# Patient Record
Sex: Female | Born: 1955 | Race: White | Hispanic: No | Marital: Married | Smoking: Never smoker
Health system: Southern US, Academic
[De-identification: ages and names within clinical notes are randomized; demographics above are authoritative.]

## PROBLEM LIST (undated history)

## (undated) DIAGNOSIS — E785 Hyperlipidemia, unspecified: Secondary | ICD-10-CM

## (undated) DIAGNOSIS — L719 Rosacea, unspecified: Secondary | ICD-10-CM

## (undated) DIAGNOSIS — K219 Gastro-esophageal reflux disease without esophagitis: Secondary | ICD-10-CM

## (undated) DIAGNOSIS — R001 Bradycardia, unspecified: Secondary | ICD-10-CM

## (undated) DIAGNOSIS — K760 Fatty (change of) liver, not elsewhere classified: Secondary | ICD-10-CM

## (undated) DIAGNOSIS — R002 Palpitations: Secondary | ICD-10-CM

## (undated) DIAGNOSIS — I10 Essential (primary) hypertension: Secondary | ICD-10-CM

## (undated) DIAGNOSIS — C801 Malignant (primary) neoplasm, unspecified: Secondary | ICD-10-CM

## (undated) DIAGNOSIS — G4700 Insomnia, unspecified: Secondary | ICD-10-CM

## (undated) HISTORY — DX: Essential (primary) hypertension: I10

## (undated) HISTORY — PX: HX DILATION AND CURETTAGE: SHX78

## (undated) HISTORY — DX: Insomnia, unspecified: G47.00

## (undated) HISTORY — PX: MOUTH SURGERY: SHX715

## (undated) HISTORY — DX: Palpitations: R00.2

## (undated) HISTORY — DX: Gastro-esophageal reflux disease without esophagitis: K21.9

## (undated) HISTORY — DX: Fatty (change of) liver, not elsewhere classified: K76.0

## (undated) HISTORY — DX: Rosacea, unspecified: L71.9

## (undated) HISTORY — DX: Bradycardia, unspecified: R00.1

## (undated) HISTORY — PX: SKIN CANCER EXCISION: SHX779

## (undated) HISTORY — DX: Hyperlipidemia, unspecified: E78.5

## (undated) HISTORY — PX: COLONOSCOPY: SHX174

## (undated) HISTORY — DX: Gilbert syndrome: E80.4

## (undated) NOTE — Progress Notes (Signed)
 Formatting of this note might be different from the original.  Subjective   Patient ID: Barbara Park is a 77 y.o. female presenting to the Urgent Care with a chief complaint of Eye Problem (Right eye, draining, pus, x 3 days).    Right eye purulent drainage and possible stye to lower eyelid. States she had a telehealth visit this morning and the provider would not give her any antibiotics, told her to come here to be evaluated. She states there is no trauma.     History provided by:  Patient  Language interpreter used: No    Eye Problem  Location:  Right eye  Quality:  Tearing and burning  Severity:  Mild  Onset quality:  Sudden  Duration:  3 days  Timing:  Intermittent  Progression:  Waxing and waning  Chronicity:  New  Context: not direct trauma, not foreign body and not scratch    Relieved by:  Heat  Associated symptoms: crusting, discharge, itching and tearing    Risk factors: no previous injury to eye      Objective   BP 136/88 (BP Location: Left arm, Patient Position: Sitting, BP Cuff Size: Large adult)   Pulse 60   Temp 37.1 C (98.8 F) (Oral)   Resp 14   Ht 1.689 m (5' 6.5)   Wt 106 kg (233 lb)   SpO2 98%   BMI 37.04 kg/m     Physical Exam  Vitals reviewed.   Constitutional:       Appearance: Normal appearance.   Eyes:      General:         Right eye: Hordeolum present.      Conjunctiva/sclera:      Right eye: Exudate present.   Cardiovascular:      Rate and Rhythm: Normal rate.   Pulmonary:      Effort: Pulmonary effort is normal.   Skin:     General: Skin is warm and dry.   Neurological:      General: No focal deficit present.      Mental Status: She is alert and oriented to person, place, and time.   Psychiatric:         Mood and Affect: Mood normal.         Behavior: Behavior normal.         Assessment & Plan    Assessment & Plan  Hordeolum internum of right lower eyelid        Other orders    tobramycin-dexAMETHasone (Tobradex) ophthalmic suspension; Administer 1 drop into the right eye every 4  hours while awake for 10 days.    In-House Lab Results:   No results found for this or any previous visit.     In-House Imaging Reads:        Procedure Documentation:  Procedures     ED Course & MDM   MDM - Medical Decision Making: Differential diagnosis considered include: stye vs conjunctivitis  Electronically signed by Dufm Stephane Lunger, CRNP at 01/20/2024 11:31 AM EDT

---

## 1998-10-27 ENCOUNTER — Other Ambulatory Visit (HOSPITAL_COMMUNITY): Payer: Self-pay

## 2018-04-02 DIAGNOSIS — R5381 Other malaise: Secondary | ICD-10-CM | POA: Insufficient documentation

## 2018-04-02 DIAGNOSIS — E669 Obesity, unspecified: Secondary | ICD-10-CM | POA: Insufficient documentation

## 2019-09-01 DIAGNOSIS — N952 Postmenopausal atrophic vaginitis: Secondary | ICD-10-CM | POA: Insufficient documentation

## 2019-09-01 DIAGNOSIS — Z809 Family history of malignant neoplasm, unspecified: Secondary | ICD-10-CM | POA: Insufficient documentation

## 2021-07-18 ENCOUNTER — Other Ambulatory Visit (INDEPENDENT_AMBULATORY_CARE_PROVIDER_SITE_OTHER): Payer: Self-pay | Admitting: Family Medicine

## 2021-08-29 ENCOUNTER — Encounter (INDEPENDENT_AMBULATORY_CARE_PROVIDER_SITE_OTHER): Payer: Self-pay

## 2021-08-29 ENCOUNTER — Ambulatory Visit (INDEPENDENT_AMBULATORY_CARE_PROVIDER_SITE_OTHER): Payer: Self-pay | Admitting: OTOLARYNGOLOGY

## 2021-09-21 ENCOUNTER — Encounter (INDEPENDENT_AMBULATORY_CARE_PROVIDER_SITE_OTHER): Payer: Self-pay | Admitting: OTOLARYNGOLOGY

## 2021-10-13 ENCOUNTER — Other Ambulatory Visit (INDEPENDENT_AMBULATORY_CARE_PROVIDER_SITE_OTHER): Payer: Self-pay | Admitting: Family Medicine

## 2021-10-13 MED ORDER — METOPROLOL SUCCINATE ER 50 MG TABLET,EXTENDED RELEASE 24 HR
ORAL_TABLET | ORAL | 1 refills | Status: DC
Start: 2021-10-13 — End: 2021-10-24

## 2021-10-21 ENCOUNTER — Other Ambulatory Visit (INDEPENDENT_AMBULATORY_CARE_PROVIDER_SITE_OTHER): Payer: Self-pay | Admitting: Family Medicine

## 2021-12-14 ENCOUNTER — Encounter (INDEPENDENT_AMBULATORY_CARE_PROVIDER_SITE_OTHER): Payer: Self-pay | Admitting: Family Medicine

## 2022-02-07 ENCOUNTER — Encounter (INDEPENDENT_AMBULATORY_CARE_PROVIDER_SITE_OTHER): Payer: Medicare Other | Admitting: Family Medicine

## 2022-02-08 ENCOUNTER — Encounter (INDEPENDENT_AMBULATORY_CARE_PROVIDER_SITE_OTHER): Payer: Self-pay | Admitting: NURSE PRACTITIONER

## 2022-02-14 ENCOUNTER — Encounter (INDEPENDENT_AMBULATORY_CARE_PROVIDER_SITE_OTHER): Payer: Medicare Other | Admitting: Family Medicine

## 2022-03-08 ENCOUNTER — Encounter (INDEPENDENT_AMBULATORY_CARE_PROVIDER_SITE_OTHER): Payer: Self-pay | Admitting: INTERVENTIONAL CARDIOLOGY

## 2022-03-13 ENCOUNTER — Encounter (INDEPENDENT_AMBULATORY_CARE_PROVIDER_SITE_OTHER): Payer: Medicare Other | Admitting: NURSE PRACTITIONER

## 2022-03-14 ENCOUNTER — Encounter (INDEPENDENT_AMBULATORY_CARE_PROVIDER_SITE_OTHER): Payer: Medicare Other | Admitting: Family Medicine

## 2022-04-10 ENCOUNTER — Encounter (INDEPENDENT_AMBULATORY_CARE_PROVIDER_SITE_OTHER): Payer: Medicare Other | Admitting: NURSE PRACTITIONER

## 2022-04-17 ENCOUNTER — Ambulatory Visit (INDEPENDENT_AMBULATORY_CARE_PROVIDER_SITE_OTHER): Payer: Medicare Other | Admitting: Family Medicine

## 2022-04-17 ENCOUNTER — Other Ambulatory Visit: Payer: Self-pay

## 2022-04-17 ENCOUNTER — Encounter (INDEPENDENT_AMBULATORY_CARE_PROVIDER_SITE_OTHER): Payer: Self-pay | Admitting: Family Medicine

## 2022-04-17 VITALS — BP 121/84 | HR 78 | Temp 98.1°F | Resp 20 | Ht 66.0 in | Wt 232.0 lb

## 2022-04-17 DIAGNOSIS — E559 Vitamin D deficiency, unspecified: Secondary | ICD-10-CM

## 2022-04-17 DIAGNOSIS — E782 Mixed hyperlipidemia: Secondary | ICD-10-CM

## 2022-04-17 DIAGNOSIS — J323 Chronic sphenoidal sinusitis: Secondary | ICD-10-CM | POA: Insufficient documentation

## 2022-04-17 DIAGNOSIS — I10 Essential (primary) hypertension: Secondary | ICD-10-CM

## 2022-04-17 MED ORDER — PRAVASTATIN 40 MG TABLET
40.0000 mg | ORAL_TABLET | Freq: Every day | ORAL | 1 refills | Status: DC
Start: 2022-04-17 — End: 2022-11-17

## 2022-04-17 MED ORDER — CEFDINIR 300 MG CAPSULE
300.0000 mg | ORAL_CAPSULE | Freq: Two times a day (BID) | ORAL | 1 refills | Status: DC
Start: 2022-04-17 — End: 2022-07-03

## 2022-04-17 MED ORDER — FLUTICASONE PROPIONATE 50 MCG/ACTUATION NASAL SPRAY,SUSPENSION
2.0000 | Freq: Every day | NASAL | 1 refills | Status: AC | PRN
Start: 2022-04-17 — End: 2022-10-14

## 2022-04-17 MED ORDER — LISINOPRIL 10 MG-HYDROCHLOROTHIAZIDE 12.5 MG TABLET
1.0000 | ORAL_TABLET | Freq: Every day | ORAL | 1 refills | Status: DC
Start: 2022-04-17 — End: 2022-11-17

## 2022-04-17 MED ORDER — METOPROLOL SUCCINATE ER 50 MG TABLET,EXTENDED RELEASE 24 HR
ORAL_TABLET | ORAL | 1 refills | Status: DC
Start: 2022-04-17 — End: 2022-11-17

## 2022-04-17 MED ORDER — FLUCONAZOLE 150 MG TABLET
150.0000 mg | ORAL_TABLET | Freq: Once | ORAL | 1 refills | Status: AC
Start: 2022-04-17 — End: 2022-04-17

## 2022-04-17 MED ORDER — ERGOCALCIFEROL (VITAMIN D2) 1,250 MCG (50,000 UNIT) CAPSULE
50000.0000 [IU] | ORAL_CAPSULE | ORAL | 1 refills | Status: AC
Start: 2022-04-17 — End: 2022-10-14

## 2022-04-17 MED ORDER — MUPIROCIN 2 % TOPICAL OINTMENT
TOPICAL_OINTMENT | Freq: Three times a day (TID) | CUTANEOUS | 2 refills | Status: DC
Start: 2022-04-17 — End: 2022-07-03

## 2022-04-17 NOTE — Nursing Note (Signed)
04/17/22 1641   Fall Risk Assessment   Do you feel unsteady when standing or walking? No   Do you worry about falling? No   Have you fallen in the past year? No

## 2022-04-17 NOTE — Nursing Note (Signed)
04/17/22 1641   Recent Weight Change   Have you had a recent unexplained weight loss or gain? N   Health Education and Literacy   How often do you have a problem understanding what is told to you about your medical condition?  Never   Domestic Violence   Because we are aware of abuse and domestic violence today, we ask all patients: Are you being hurt, hit, or frightened by anyone at your home or in your life?  N   Basic Needs   Do you have any basic needs within your home that are not being met? (such as Food, Shelter, Games developer, Tranportation, paying for bills and/or medications) N   Advanced Directives   Do you have any advanced directives? Living Will & MPOA   Do you have the Advanced Directive(s) so we can scan them to your chart? Barbara Park

## 2022-04-17 NOTE — Progress Notes (Signed)
FAMILY MEDICINE, MEDICAL OFFICE BUILDING  [address]  [place] [address]       Name: Barbara Park MRN:  [id number]   Date: 04/17/2022 Age: 66 y.o.          Provider: Mickey Farber, DO    Reason for visit: Follow Up (Patient last visit/CDM 10/14/2020.)      History of Present Illness:  04/17/2022:  This 66 year old female last seen June 22 when she had labs on 10/07/2020 complains of sinus congestion and blood when she is low nose left ear pain.  She coughs when she 1st wakes up in the morning.  She has sinus pressure in her head and has gained 7 lb.  She needs refills on all her medications.  She has Flonase but she is not been using it.  I am going to order labs today for now and in the future and also going to order her vitamin-D level because she is had a low vitamin-D level in the past and has not been compliant with taking it regular.  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 Alwyn Ren         Allergies:  Allergies   Allergen Reactions    Augmentin [Amoxicillin-Pot Clavulanate] Nausea/ Vomiting     Medications:  Current Outpatient Medications   Medication Sig    cefdinir (OMNICEF) 300 mg Oral Capsule Take 1 Capsule (300 mg total) by mouth Twice daily    ergocalciferol, vitamin D2, (DRISDOL) 1,250 mcg (50,000 unit) Oral Capsule Take 1 Capsule (50,000 Units total) by mouth Every 7 days for 180 days    fluconazole (DIFLUCAN) 150 mg Oral Tablet Take 1 Tablet (150 mg total) by mouth  One time for 1 dose    fluticasone propionate (FLONASE) 50 mcg/actuation Nasal Spray, Suspension Administer 2 Sprays into each nostril Once per day as needed for up to 180 days    ketoconazole (NIZORAL) 2 % Shampoo Apply topically Every MON and FRI    lisinopriL-hydrochlorothiazide (ZESTORETIC) 10-12.5 mg Oral Tablet Take 1 Tablet by mouth Once a day for 180 days    metoprolol succinate (TOPROL-XL) 50 mg Oral Tablet Sustained Release 24 hr MUST BE SEEN IN OFFICE BEFORE ANY FURTHER REFILLS TAKE 1 TABLET EVERY DAY AS DIRECTED    mupirocin (BACTROBAN) 2 % Ointment Apply topically Three times a day Apply small amount intranasally up to 3 times a day    pravastatin (PRAVACHOL) 40 mg Oral Tablet Take 1 Tablet (40 mg total) by mouth Once a day for 180 days     Family History:  Family Medical History:       Problem Relation (Age  of Onset)    Breast Cancer Mother    Congestive Heart Failure Father    Diabetes Mother    Hypertension (High Blood Pressure) Mother, Father            Social History:  Social History     Socioeconomic History    Marital status: Married   Tobacco Use    Smoking status: Never    Smokeless tobacco: Never   Vaping Use    Vaping Use: Never used   Substance and Sexual Activity    Alcohol use: Never    Drug use: Never           Review of Systems:  Any pertinent Review of Systems as addressed in the HPI above.    Physical Exam:  Vital Signs:  Vitals:    04/17/22 1642   BP: 121/84   Pulse: 78   Resp: 20   Temp: 36.7 C (98.1 F)   TempSrc: Temporal   SpO2: 98%   Weight: 105 kg (232 lb)   Height: 1.676 m (5\' 6" )   BMI: 37.52     Physical Exam  Vitals and nursing note reviewed.   Constitutional:       Appearance: Normal appearance. She is well-developed and well-groomed. She is obese.      Comments: BMI is 37.45 kilograms/meter squared   HENT:      Head: Normocephalic and atraumatic.      Right Ear: Tympanic membrane, ear canal and external ear normal.      Left Ear: Tympanic membrane, ear canal and external  ear normal.      Nose: Nose normal.      Right Turbinates: Enlarged and swollen.      Left Turbinates: Enlarged and swollen.      Mouth/Throat:      Mouth: Mucous membranes are moist.      Pharynx: Pharyngeal swelling and posterior oropharyngeal erythema present.   Eyes:      Extraocular Movements: Extraocular movements intact.      Conjunctiva/sclera: Conjunctivae normal.      Pupils: Pupils are equal, round, and reactive to light.   Cardiovascular:      Rate and Rhythm: Normal rate and regular rhythm.   Pulmonary:      Effort: Pulmonary effort is normal.      Breath sounds: Normal breath sounds.   Abdominal:      General: Abdomen is flat. Bowel sounds are normal.      Palpations: Abdomen is soft.   Musculoskeletal:         General: Normal range of motion.      Cervical back: Normal range of motion and neck supple.   Skin:     General: Skin is warm and dry.      Capillary Refill: Capillary refill takes less than 2 seconds.   Neurological:      General: No focal deficit present.      Mental Status: She is alert and oriented to person, place, and time. Mental status is at baseline.   Psychiatric:         Mood and Affect: Mood normal.         Behavior: Behavior normal.         Thought Content: Thought content normal.         Judgment: Judgment normal.       Assessment:    ICD-10-CM    1. Essential hypertension  I10 LIPID PANEL     CBC/DIFF  URINALYSIS, MACROSCOPIC AND MICROSCOPIC W/CULTURE REFLEX     BASIC METABOLIC PANEL     HEPATIC FUNCTION PANEL     THYROID STIMULATING HORMONE (SENSITIVE TSH)     LIPID PANEL     HEPATIC FUNCTION PANEL     CBC/DIFF     URINALYSIS, MACROSCOPIC AND MICROSCOPIC W/CULTURE REFLEX     BASIC METABOLIC PANEL     THYROID STIMULATING HORMONE (SENSITIVE TSH)      2. Hyperlipidemia, mixed  E78.2       3. 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 Vitamin D deficiency  E55.9 VITAMIN D 25 TOTAL         Plan:  Orders Placed This Encounter    LIPID PANEL    CBC/DIFF    URINALYSIS, MACROSCOPIC  AND MICROSCOPIC W/CULTURE REFLEX    BASIC METABOLIC PANEL    HEPATIC FUNCTION PANEL    THYROID STIMULATING HORMONE (SENSITIVE TSH)    LIPID PANEL    HEPATIC FUNCTION PANEL    CBC/DIFF    URINALYSIS, MACROSCOPIC AND MICROSCOPIC W/CULTURE REFLEX    BASIC METABOLIC PANEL    THYROID STIMULATING HORMONE (SENSITIVE TSH)    VITAMIN D 25 TOTAL    ergocalciferol, vitamin D2, (DRISDOL) 1,250 mcg (50,000 unit) Oral Capsule    fluticasone propionate (FLONASE) 50 mcg/actuation Nasal Spray, Suspension    lisinopriL-hydrochlorothiazide (ZESTORETIC) 10-12.5 mg Oral Tablet    metoprolol succinate (TOPROL-XL) 50 mg Oral Tablet Sustained Release 24 hr    pravastatin (PRAVACHOL) 40 mg Oral Tablet    cefdinir (OMNICEF) 300 mg Oral Capsule    mupirocin (BACTROBAN) 2 % Ointment    fluconazole (DIFLUCAN) 150 mg Oral Tablet     Today for now and in 6 months I ordered a lipid panel CBC with diff urinalysis basic metabolic panel thyroid-stimulating hormone hepatic function panel.  Also ordered a vitamin-D level.  Today I refilled her vitamin-D her Flonase lisinopril hydrochlorothiazide metoprolol 50 pravastatin 40 and I am going to put her on Omnicef 300 mg tablet with Bactroban to be put inside her nose.  I am also going to give her a Diflucan 150 once a day.  More than 50% of the visit was spent counseling and coordinating care.  All questions were answered to his satisfaction of the patient.  The patient should get a mammogram every year and a bone density every 2 years.  She is getting her high-dose flu shot at [facility] and I recommend she get the Prevnar 20 also.  Also recommend she get an RSV and COVID vaccine.  We will schedule her see Dr. Gaynell Face when she comes back in 6 months.  Patient should stay on a heart healthy low-fat low-cholesterol diet and avoid high fructose corn syrup and concentrated sweets.    Return in about 6 months (around 10/17/2022).    Mickey Farber, DO     Portions of this note may be dictated using voice recognition  software or a dictation service. Variances in spelling and vocabulary are possible and unintentional. Not all errors are caught/corrected. Please notify the Thereasa Parkin if any discrepancies are noted or if the meaning of any statement is not clear.

## 2022-04-17 NOTE — Nursing Note (Signed)
04/17/22 1642   Depression Screen   Little interest or pleasure in doing things. 0   Feeling down, depressed, or hopeless 0   PHQ 2 Total 0

## 2022-05-29 ENCOUNTER — Encounter (INDEPENDENT_AMBULATORY_CARE_PROVIDER_SITE_OTHER): Payer: Medicare Other | Admitting: NURSE PRACTITIONER

## 2022-07-03 ENCOUNTER — Other Ambulatory Visit: Payer: Self-pay

## 2022-07-03 ENCOUNTER — Ambulatory Visit (INDEPENDENT_AMBULATORY_CARE_PROVIDER_SITE_OTHER): Payer: Medicare Other

## 2022-07-03 ENCOUNTER — Encounter (INDEPENDENT_AMBULATORY_CARE_PROVIDER_SITE_OTHER): Payer: Self-pay

## 2022-07-03 ENCOUNTER — Telehealth (INDEPENDENT_AMBULATORY_CARE_PROVIDER_SITE_OTHER): Payer: Self-pay | Admitting: Family Medicine

## 2022-07-03 ENCOUNTER — Inpatient Hospital Stay
Admission: RE | Admit: 2022-07-03 | Discharge: 2022-07-03 | Disposition: A | Discharge status: Home / Self Care | Payer: Medicare Other | Source: Ambulatory Visit

## 2022-07-03 VITALS — BP 158/95 | HR 72 | Temp 98.7°F | Ht 66.0 in | Wt 227.6 lb

## 2022-07-03 DIAGNOSIS — M549 Dorsalgia, unspecified: Secondary | ICD-10-CM

## 2022-07-03 DIAGNOSIS — I10 Essential (primary) hypertension: Secondary | ICD-10-CM

## 2022-07-03 DIAGNOSIS — M545 Low back pain, unspecified: Secondary | ICD-10-CM

## 2022-07-03 DIAGNOSIS — M546 Pain in thoracic spine: Secondary | ICD-10-CM | POA: Insufficient documentation

## 2022-07-03 DIAGNOSIS — M2578 Osteophyte, vertebrae: Secondary | ICD-10-CM

## 2022-07-03 DIAGNOSIS — R0981 Nasal congestion: Secondary | ICD-10-CM

## 2022-07-03 MED ORDER — BACLOFEN 5 MG TABLET
5.0000 mg | ORAL_TABLET | Freq: Three times a day (TID) | ORAL | 0 refills | Status: AC
Start: 2022-07-03 — End: 2022-08-02

## 2022-07-03 MED ORDER — LIDOCAINE 5 % TOPICAL PATCH
1.0000 | MEDICATED_PATCH | Freq: Every day | CUTANEOUS | 2 refills | Status: DC
Start: 2022-07-03 — End: 2022-11-17

## 2022-07-03 NOTE — Telephone Encounter (Addendum)
Informed pt of xray results - pt agrees for PT - requests [facility]    ----- Message from Waylan Boga, [place] sent at 07/03/2022  4:24 PM EST -----  Can you contact the patient and inform her that her thoracic xray exhibits bone spurs, dextroscoliosis, and spinal stenosis? These are all degenerative changes. We can refer for physical therapy if that is something she would be interested in.

## 2022-07-03 NOTE — Progress Notes (Signed)
FAMILY MEDICINE, MEDICAL OFFICE BUILDING  [place] [address]      Mamye D Abate  1956/05/12  [id number]    Date of Service: 07/03/2022  8:30 AM EST    Chief complaint:   Chief Complaint   Patient presents with    Back Pain     Middle back pain, mostly right sided, sometimes goes up to shoulder. Possibly injured it while mowing grass over a year ago. Gotten worse over last 6 weeks.     Subjective:     This is a case of a 67 y.o. year old female who comes in today for complaint(s) of: Back Pain  Patient presents for presents evaluation of low back problems.  Symptoms have been present for 1 year and include pain sharp in character; 8/10 in severity). Initial inciting event: Hit a pothole while on a riding mower last year, noticeable jarring of the back. Alleviating factors identifiable by patient are recumbency. Exacerbating factors identifiable by patient are increased intrathoracic pressure (cough, sneeze, etc.), bending forwards, bending backwards, bending sideways. Treatments so far initiated by patient: Ibuprofen and muscle rub mildly effective. Previous workup: None. Previous treatments: None.    Patient states that her blood pressure has been normal at home. Endorses that her back pain and anxiety of being in the office are to blame for her hypertension.    ROS:  Review of Systems   Constitutional: Negative.    HENT:  Positive for congestion.    Eyes: Negative.    Respiratory: Negative.     Cardiovascular: Negative.    Gastrointestinal: Negative.    Endocrine: Negative.    Genitourinary: Negative.    Musculoskeletal:  Positive for back pain.   Skin: Negative.    Allergic/Immunologic: Negative.    Neurological: Negative.    Hematological: Negative.    Psychiatric/Behavioral: Negative.          History:  Active Ambulatory Problems     Diagnosis Date Noted    Essential hypertension 04/17/2022    Hyperlipidemia, mixed 04/17/2022    Sinusitis chronic, sphenoidal 04/17/2022    Vitamin D  deficiency 04/17/2022    Back pain 07/03/2022     Resolved Ambulatory Problems     Diagnosis Date Noted    No Resolved Ambulatory Problems     Past Medical History:   Diagnosis Date    Fatty liver     GERD (gastroesophageal reflux disease)     Gilbert's disease     HLD (hyperlipidemia)     Insomnia     Palpitations     Palpitations     Rosacea     Sinus bradycardia        Past Surgical History:   Procedure Laterality Date    CESAREAN SECTION      x 2    COLONOSCOPY      HX DILATION AND CURETTAGE      MOUTH SURGERY      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 Theodis Sato     Current Outpatient Medications   Medication Sig    ascorbic acid (VITAMIN C) 1,000 mg Oral Tablet Take 1 Tablet (1,000 mg total) by mouth Once a day    B.animalis,bifid,infantis,long (PROBIOTIC 4X ORAL) Take by mouth    baclofen (LIORESAL) 5 mg Oral Tablet Take 1 Tablet (5 mg total) by mouth Three times a day for 30 days  ergocalciferol, vitamin D2, (DRISDOL) 1,250 mcg (50,000 unit) Oral Capsule Take 1 Capsule (50,000 Units total) by mouth Every 7 days for 180 days    fluticasone propionate (FLONASE) 50 mcg/actuation Nasal Spray, Suspension Administer 2 Sprays into each nostril Once per day as needed for up to 180 days    ketoconazole (NIZORAL) 2 % Shampoo Apply topically Every MON and FRI    lidocaine (LIDODERM) 5 % Adhesive Patch, Medicated Place 1 Patch (700 mg total) on the skin Once a day    lisinopriL-hydrochlorothiazide (ZESTORETIC) 10-12.5 mg Oral Tablet Take 1 Tablet by mouth Once a day for 180 days    metoprolol succinate (TOPROL-XL) 50 mg Oral Tablet Sustained Release 24 hr MUST BE SEEN IN OFFICE BEFORE ANY FURTHER REFILLS TAKE 1 TABLET EVERY DAY AS DIRECTED    multivitamin with folic acid (THERA) [id number] mcg Oral Tablet Take 1 Tablet by mouth Once a day    pravastatin (PRAVACHOL) 40 mg Oral Tablet Take 1 Tablet (40 mg total) by mouth Once a day for 180 days     Objective   BP (!) 158/95  (Site: Left, Patient Position: Sitting, Cuff Size: Adult)   Pulse 72   Temp 37.1 C (98.7 F) (Temporal)   Ht 1.676 m (5' 6"$ )   Wt 103 kg (227 lb 9.6 oz)   SpO2 98%   BMI 36.74 kg/m     BMI addressed: Advised on diet, weight loss, and exercise to reduce above normal BMI.     Physical Exam  Constitutional:       General: She is not in acute distress.     Appearance: Normal appearance. She is normal weight.   HENT:      Head: Normocephalic.      Nose: Congestion present.      Mouth/Throat:      Mouth: Mucous membranes are moist.      Pharynx: No oropharyngeal exudate or posterior oropharyngeal erythema.   Eyes:      Pupils: Pupils are equal, round, and reactive to light.   Cardiovascular:      Rate and Rhythm: Normal rate and regular rhythm.      Pulses: Normal pulses.      Heart sounds: Normal heart sounds.   Pulmonary:      Effort: Pulmonary effort is normal.      Breath sounds: Normal breath sounds.   Abdominal:      General: Abdomen is flat.      Palpations: Abdomen is soft.   Musculoskeletal:         General: Normal range of motion.        Back:       Comments: 1- Patient endorses this as the focal point of her pain. It is non-tender to palpation with no swelling or erythema to note.     2- During acute flares which patient has difficulty determining provocation, pain radiates to this location.   Skin:     General: Skin is warm and dry.      Capillary Refill: Capillary refill takes less than 2 seconds.   Neurological:      General: No focal deficit present.      Mental Status: She is alert and oriented to person, place, and time. Mental status is at baseline.   Psychiatric:         Attention and Perception: Attention normal.         Mood and Affect: Mood and affect normal.  Speech: Speech normal.         Behavior: Behavior normal. Behavior is cooperative.         Thought Content: Thought content normal.        Assessment/Plan       ICD-10-CM    1. Essential hypertension  I10       2. Back pain,  unspecified back location, unspecified back pain laterality, unspecified chronicity  M54.9 XR THORACIC SPINE 2 VIEW      3. Sinus congestion  R09.81           Orders Placed This Encounter    XR THORACIC SPINE 2 VIEW    lidocaine (LIDODERM) 5 % Adhesive Patch, Medicated    baclofen (LIORESAL) 5 mg Oral Tablet     Health Maintenance:     During assessment, patient's muscles in her lower back seemed excessively taut. I ordered baclofen in order to relax these muscles and alleviate associated symptoms. As these symptoms have been intermittent for over 1 year, I extended the duration of therapy to 1 month. I also ordered lidocaine patches to assist with lower back pain and provide adjunctive relief. I ordered a thoracic 2 view xray to rule out insidious etiology for pain.  Patient counseling:  The patient was given the opportunity to ask questions and those questions were answered to the patient's satisfaction. The patient was encouraged to call with any additional questions or concerns. I instructed the patient to call back if symptoms persist or worsen.   Discussed with patient, the effects and side effects of medications. Medication safety was discussed. A copy of the patient's medication list was printed and given to the patient. A good faith effort was made to reconcile the patient's medications.       Follow up: Return if symptoms worsen or fail to improve.  Waylan Boga, APRN,NP-C

## 2022-07-03 NOTE — Nursing Note (Signed)
07/03/22 0847   Domestic Violence   Because we are aware of abuse and domestic violence today, we ask all patients: Are you being hurt, hit, or frightened by anyone at your home or in your life?  N   Basic Needs   Do you have any basic needs within your home that are not being met? (such as Food, Shelter, Games developer, Tranportation, paying for bills and/or medications) N

## 2022-07-03 NOTE — Nursing Note (Signed)
07/03/22 0847   Fall Risk Assessment   Do you feel unsteady when standing or walking? No   Do you worry about falling? No   Have you fallen in the past year? No

## 2022-07-14 ENCOUNTER — Other Ambulatory Visit: Payer: Self-pay

## 2022-08-15 ENCOUNTER — Encounter (INDEPENDENT_AMBULATORY_CARE_PROVIDER_SITE_OTHER): Payer: Self-pay

## 2022-10-02 DIAGNOSIS — N950 Postmenopausal bleeding: Secondary | ICD-10-CM | POA: Insufficient documentation

## 2022-10-02 DIAGNOSIS — R82998 Other abnormal findings in urine: Secondary | ICD-10-CM | POA: Insufficient documentation

## 2022-10-04 ENCOUNTER — Other Ambulatory Visit (HOSPITAL_COMMUNITY): Payer: Self-pay | Admitting: Student in an Organized Health Care Education/Training Program

## 2022-10-04 DIAGNOSIS — Z1231 Encounter for screening mammogram for malignant neoplasm of breast: Secondary | ICD-10-CM

## 2022-10-18 ENCOUNTER — Encounter (INDEPENDENT_AMBULATORY_CARE_PROVIDER_SITE_OTHER): Payer: Self-pay | Admitting: Family Medicine

## 2022-10-18 ENCOUNTER — Inpatient Hospital Stay
Admission: RE | Admit: 2022-10-18 | Discharge: 2022-10-18 | Disposition: A | Discharge status: Home / Self Care | Payer: Medicare Other | Source: Ambulatory Visit | Attending: Student in an Organized Health Care Education/Training Program | Admitting: Student in an Organized Health Care Education/Training Program

## 2022-10-18 ENCOUNTER — Encounter (HOSPITAL_COMMUNITY): Payer: Self-pay

## 2022-10-18 ENCOUNTER — Other Ambulatory Visit: Payer: Self-pay

## 2022-10-18 DIAGNOSIS — Z1231 Encounter for screening mammogram for malignant neoplasm of breast: Secondary | ICD-10-CM | POA: Insufficient documentation

## 2022-10-18 HISTORY — DX: Malignant (primary) neoplasm, unspecified (CMS HCC): C80.1

## 2022-10-26 ENCOUNTER — Other Ambulatory Visit
Payer: Medicare Other | Attending: Student in an Organized Health Care Education/Training Program | Admitting: Student in an Organized Health Care Education/Training Program

## 2022-10-26 DIAGNOSIS — N950 Postmenopausal bleeding: Secondary | ICD-10-CM

## 2022-10-26 DIAGNOSIS — N858 Other specified noninflammatory disorders of uterus: Secondary | ICD-10-CM | POA: Insufficient documentation

## 2022-10-31 ENCOUNTER — Other Ambulatory Visit: Payer: Self-pay

## 2022-11-01 DIAGNOSIS — N950 Postmenopausal bleeding: Secondary | ICD-10-CM

## 2022-11-01 LAB — SURGICAL PATHOLOGY SPECIMEN

## 2022-11-09 ENCOUNTER — Encounter (INDEPENDENT_AMBULATORY_CARE_PROVIDER_SITE_OTHER): Payer: Self-pay

## 2022-11-10 ENCOUNTER — Other Ambulatory Visit: Payer: Medicare Other | Attending: Family Medicine | Admitting: Family Medicine

## 2022-11-10 ENCOUNTER — Ambulatory Visit (INDEPENDENT_AMBULATORY_CARE_PROVIDER_SITE_OTHER): Payer: Medicare Other

## 2022-11-10 ENCOUNTER — Other Ambulatory Visit: Payer: Self-pay

## 2022-11-10 DIAGNOSIS — I10 Essential (primary) hypertension: Secondary | ICD-10-CM

## 2022-11-10 DIAGNOSIS — E559 Vitamin D deficiency, unspecified: Secondary | ICD-10-CM | POA: Insufficient documentation

## 2022-11-10 LAB — HEPATIC FUNCTION PANEL
ALBUMIN/GLOBULIN RATIO: 1.4 (ref 0.8–1.4)
ALBUMIN: 4 g/dL (ref 3.5–5.7)
ALKALINE PHOSPHATASE: 57 U/L (ref 34–104)
ALT (SGPT): 46 U/L (ref 7–52)
AST (SGOT): 37 U/L (ref 13–39)
BILIRUBIN DIRECT: 0.08 md/dL (ref <=0.20)
BILIRUBIN TOTAL: 0.6 mg/dL (ref 0.3–1.2)
BILIRUBIN, INDIRECT: 0.52 mg/dL (ref <=1)
GLOBULIN: 2.9 (ref 2.9–5.4)
PROTEIN TOTAL: 6.9 g/dL (ref 6.4–8.9)

## 2022-11-10 LAB — CBC WITH DIFF
BASOPHIL #: 0.1 10*3/uL (ref 0.00–0.10)
BASOPHIL %: 2 % — ABNORMAL HIGH (ref 0–1)
EOSINOPHIL #: 0.1 10*3/uL (ref 0.00–0.50)
EOSINOPHIL %: 2 %
HCT: 42.9 % — ABNORMAL HIGH (ref 31.2–41.9)
HGB: 14.7 g/dL — ABNORMAL HIGH (ref 10.9–14.3)
LYMPHOCYTE #: 2.1 10*3/uL (ref 1.00–3.00)
LYMPHOCYTE %: 43 % (ref 16–44)
MCH: 30.6 pg (ref 24.7–32.8)
MCHC: 34.1 g/dL (ref 32.3–35.6)
MCV: 89.8 fL (ref 75.5–95.3)
MONOCYTE #: 0.5 10*3/uL (ref 0.30–1.00)
MONOCYTE %: 9 % (ref 5–13)
MPV: 9.6 fL (ref 7.9–10.8)
NEUTROPHIL #: 2.2 10*3/uL (ref 1.85–7.80)
NEUTROPHIL %: 44 % (ref 43–77)
PLATELETS: 166 10*3/uL (ref 140–440)
RBC: 4.78 10*6/uL (ref 3.63–4.92)
RDW: 13 % (ref 12.3–17.7)
WBC: 4.9 10*3/uL (ref 3.8–11.8)

## 2022-11-10 LAB — BASIC METABOLIC PANEL
ANION GAP: 4 mmol/L (ref 4–13)
BUN/CREA RATIO: 19 (ref 6–22)
BUN: 15 mg/dL (ref 7–25)
CALCIUM: 9.3 mg/dL (ref 8.6–10.3)
CHLORIDE: 106 mmol/L (ref 98–107)
CO2 TOTAL: 31 mmol/L (ref 21–31)
CREATININE: 0.78 mg/dL (ref 0.60–1.30)
ESTIMATED GFR: 84 mL/min/{1.73_m2} (ref >59)
GLUCOSE: 112 mg/dL — ABNORMAL HIGH (ref 74–109)
OSMOLALITY, CALCULATED: 283 mOsm/kg (ref 270–290)
POTASSIUM: 3.7 mmol/L (ref 3.5–5.1)
SODIUM: 141 mmol/L (ref 136–145)

## 2022-11-10 LAB — LIPID PANEL
CHOL/HDL RATIO: 3.4
CHOLESTEROL: 150 mg/dL (ref <200)
HDL CHOL: 44 mg/dL (ref >=40)
LDL CALC: 77 mg/dL (ref 0–100)
TRIGLYCERIDES: 143 mg/dL (ref <=150)
VLDL CALC: 29 mg/dL (ref 0–50)

## 2022-11-10 LAB — THYROID STIMULATING HORMONE (SENSITIVE TSH): TSH: 1.594 u[IU]/mL (ref 0.450–5.330)

## 2022-11-10 LAB — VITAMIN D 25 TOTAL: VITAMIN D: 26 ng/mL — ABNORMAL LOW (ref 30–100)

## 2022-11-17 ENCOUNTER — Other Ambulatory Visit: Payer: Self-pay

## 2022-11-17 ENCOUNTER — Ambulatory Visit (INDEPENDENT_AMBULATORY_CARE_PROVIDER_SITE_OTHER): Payer: Medicare Other | Admitting: Family Medicine

## 2022-11-17 ENCOUNTER — Encounter (INDEPENDENT_AMBULATORY_CARE_PROVIDER_SITE_OTHER): Payer: Self-pay | Admitting: Family Medicine

## 2022-11-17 VITALS — BP 151/83 | HR 67 | Temp 98.0°F | Resp 19 | Ht 66.0 in | Wt 231.0 lb

## 2022-11-17 DIAGNOSIS — Z1211 Encounter for screening for malignant neoplasm of colon: Secondary | ICD-10-CM | POA: Insufficient documentation

## 2022-11-17 DIAGNOSIS — J0100 Acute maxillary sinusitis, unspecified: Secondary | ICD-10-CM | POA: Insufficient documentation

## 2022-11-17 DIAGNOSIS — E559 Vitamin D deficiency, unspecified: Secondary | ICD-10-CM

## 2022-11-17 DIAGNOSIS — Z833 Family history of diabetes mellitus: Secondary | ICD-10-CM

## 2022-11-17 DIAGNOSIS — R7303 Prediabetes: Secondary | ICD-10-CM | POA: Insufficient documentation

## 2022-11-17 DIAGNOSIS — E669 Obesity, unspecified: Secondary | ICD-10-CM

## 2022-11-17 DIAGNOSIS — Z7902 Long term (current) use of antithrombotics/antiplatelets: Secondary | ICD-10-CM

## 2022-11-17 DIAGNOSIS — M545 Low back pain, unspecified: Secondary | ICD-10-CM

## 2022-11-17 DIAGNOSIS — E782 Mixed hyperlipidemia: Secondary | ICD-10-CM

## 2022-11-17 DIAGNOSIS — J329 Chronic sinusitis, unspecified: Secondary | ICD-10-CM

## 2022-11-17 DIAGNOSIS — Z7985 Long-term (current) use of injectable non-insulin antidiabetic drugs: Secondary | ICD-10-CM

## 2022-11-17 DIAGNOSIS — I10 Essential (primary) hypertension: Secondary | ICD-10-CM

## 2022-11-17 DIAGNOSIS — K219 Gastro-esophageal reflux disease without esophagitis: Secondary | ICD-10-CM

## 2022-11-17 MED ORDER — PANTOPRAZOLE 40 MG TABLET,DELAYED RELEASE
40.0000 mg | DELAYED_RELEASE_TABLET | Freq: Every day | ORAL | 1 refills | Status: DC
Start: 2022-11-17 — End: 2023-06-25

## 2022-11-17 MED ORDER — CEFDINIR 300 MG CAPSULE
300.0000 mg | ORAL_CAPSULE | Freq: Two times a day (BID) | ORAL | 1 refills | Status: DC
Start: 2022-11-17 — End: 2023-01-16

## 2022-11-17 MED ORDER — LISINOPRIL 10 MG-HYDROCHLOROTHIAZIDE 12.5 MG TABLET
1.0000 | ORAL_TABLET | Freq: Every day | ORAL | 1 refills | Status: DC
Start: 2022-11-17 — End: 2023-03-15

## 2022-11-17 MED ORDER — ERGOCALCIFEROL (VITAMIN D2) 1,250 MCG (50,000 UNIT) CAPSULE
50000.0000 [IU] | ORAL_CAPSULE | ORAL | 1 refills | Status: AC
Start: 2022-11-17 — End: 2023-05-16

## 2022-11-17 MED ORDER — METOPROLOL SUCCINATE ER 50 MG TABLET,EXTENDED RELEASE 24 HR
ORAL_TABLET | ORAL | 1 refills | Status: DC
Start: 2022-11-17 — End: 2023-03-15

## 2022-11-17 MED ORDER — PRAVASTATIN 40 MG TABLET
40.0000 mg | ORAL_TABLET | Freq: Every day | ORAL | 1 refills | Status: DC
Start: 2022-11-17 — End: 2023-06-25

## 2022-11-17 MED ORDER — FLUCONAZOLE 150 MG TABLET
150.0000 mg | ORAL_TABLET | Freq: Once | ORAL | 0 refills | Status: AC
Start: 2022-11-17 — End: 2022-11-17

## 2022-11-17 NOTE — Nursing Note (Signed)
11/17/22 1526   Recent Weight Change   Have you had a recent unexplained weight loss or gain? N   Domestic Violence   Because we are aware of abuse and domestic violence today, we ask all patients: Are you being hurt, hit, or frightened by anyone at your home or in your life?  N   Basic Needs   Do you have any basic needs within your home that are not being met? (such as Food, Shelter, [employer] fast streamer, Tranportation, paying for bills and/or medications) N

## 2022-11-17 NOTE — Progress Notes (Signed)
FAMILY MEDICINE, MEDICAL OFFICE BUILDING  [address]  [place] [address]       Name: Barbara Park MRN:  [id number]   Date: 11/17/2022 Age: 67 y.o.          Provider: Mickey Farber, DO    Reason for visit: Follow Up 6 Months      History of Present Illness:  11/17/2022:  This 67 year old female returns for six-month follow-up to review her labs and get refills on all of her medications.  Her hemoglobin was 14.7 hematocrit 42.9 sodium was 141 potassium 3.7 glucose was slightly elevated at 112 BUN 15 creatinine 0.78.  Total cholesterol was 150 HDL 44 LDL 77 triglycerides 143 TSH was 1.594 normal liver enzymes were normal vitamin-D level was low at 28 I am going to start the patient back on vitamin-D and I recommended she also take a multivitamin calcium and magnesium.  She complains of sinus headache dryness congestion history of a deviated nasal septum worse in the morning and some dizziness she does have postnasal drip and drainage she does have gastroesophageal reflux disease and I recommend that she elevate the head of the bed and she is cut out coughing has gotten better but she is want to try a proton pump inhibitor.  She would physical therapy for low back pain it [place] and exercise helps.  We need to refill her lisinopril metoprolol and pravastatin.  She has a history of diabetes on both sides of the family.  She denies chest pain nausea vomiting diarrhea constipation shortness a breath syncope or near-syncope.  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 Alwyn Ren         Allergies:  Allergies   Allergen Reactions    Augmentin [Amoxicillin-Pot Clavulanate] Nausea/ Vomiting     Medications:  Current Outpatient Medications   Medication Sig    ascorbic acid (VITAMIN C) 1,000 mg Oral Tablet Take 1 Tablet (1,000 mg total) by mouth Once a day    B.animalis,bifid,infantis,Jaquavious Mercer (PROBIOTIC 4X ORAL) Take by mouth    cefdinir (OMNICEF) 300 mg Oral Capsule Take 1 Capsule (300 mg total) by mouth Twice daily Indications: acute maxillary sinus S. pneumoniae bacteria infection    ergocalciferol, vitamin D2, (VITAMIN D) 1,250 mcg (50,000 unit) Oral Capsule Take 1 Capsule (50,000 Units total) by mouth Every 7 days for 180 days    fluconazole (DIFLUCAN) 150 mg Oral Tablet Take 1 Tablet (150 mg total) by mouth One time for 1 dose    lisinopriL-hydrochlorothiazide (ZESTORETIC) 10-12.5 mg Oral Tablet Take 1 Tablet by mouth  Once a day for 180 days    metoprolol succinate (TOPROL-XL) 50 mg Oral Tablet Sustained Release 24 hr One a day    multivitamin with folic acid (THERA) 400 mcg Oral Tablet Take 1 Tablet by mouth Once a day    pantoprazole (PROTONIX) 40 mg Oral Tablet, Delayed Release (E.C.) Take 1 Tablet (40 mg total) by mouth Once a day Indications: indigestion    pravastatin (PRAVACHOL) 40 mg Oral Tablet Take 1 Tablet (40 mg total) by mouth Once a day for 180 days     Family History:  Family Medical History:       Problem Relation (Age of Onset)    Breast Cancer Mother (87), Maternal Grandmother    Congestive Heart Failure Father    Diabetes Mother    Hypertension (High Blood Pressure) Mother, Father    No Known Problems Sister, Brother, Maternal Grandfather, Paternal Grandmother, Paternal Grandfather, Daughter, Son, Maternal Aunt, Maternal Uncle, Paternal Aunt, Paternal Education officer, community, Other            Social History:  Social History     Socioeconomic History    Marital status:  Married   Tobacco Use    Smoking status: Never    Smokeless tobacco: Never   Vaping Use    Vaping status: Never Used   Substance and Sexual Activity    Alcohol use: Never    Drug use: Never           Review of Systems:  Any pertinent Review of Systems as addressed in the HPI above.    Physical Exam:  Vital Signs:  Vitals:    11/17/22 1526   BP: (!) 151/83   Pulse: 67   Resp: 19   Temp: 36.7 C (98 F)   TempSrc: Temporal   SpO2: 98%   Weight: 105 kg (231 lb)   Height: 1.676 m (5\' 6" )   BMI: 37.36     Physical Exam  Vitals and nursing note reviewed.   Constitutional:       General: She is awake.      Appearance: Normal appearance. She is well-developed and well-groomed. She is obese.   HENT:      Head: Normocephalic and atraumatic.      Right Ear: Hearing, tympanic membrane, ear canal and external ear normal.      Left Ear: Hearing, tympanic membrane, ear canal and external ear normal.      Nose:      Right Turbinates: Enlarged and swollen.      Left Turbinates: Enlarged and swollen.      Right Sinus: Maxillary sinus tenderness present.      Left Sinus: Maxillary sinus tenderness present.      Mouth/Throat:      Mouth: Mucous membranes are moist.      Pharynx: Oropharynx is clear.   Eyes:      Extraocular Movements: Extraocular movements intact.      Conjunctiva/sclera: Conjunctivae normal.      Pupils: Pupils are equal, round, and reactive to light.   Cardiovascular:      Rate and Rhythm: Normal rate and regular rhythm.      Pulses:           Carotid pulses are 2+ on the right side and 2+ on the left side.       Radial pulses are 2+ on the right side and 2+ on the left side.        Dorsalis pedis pulses are 2+ on the  right side.        Posterior tibial pulses are 2+ on the right side and 2+ on the left side.      Heart sounds: Normal heart sounds, S1 normal and S2 normal.   Pulmonary:      Effort: Pulmonary effort is normal.      Breath sounds: Normal breath sounds.   Abdominal:      General: Abdomen is flat. Bowel  sounds are normal.      Palpations: Abdomen is soft.   Musculoskeletal:         General: Normal range of motion.      Cervical back: Normal range of motion and neck supple.      Right lower leg: No edema.      Left lower leg: No edema.   Skin:     General: Skin is warm and dry.      Capillary Refill: Capillary refill takes less than 2 seconds.   Neurological:      General: No focal deficit present.      Mental Status: She is alert and oriented to person, place, and time. Mental status is at baseline.      Cranial Nerves: Cranial nerves 2-12 are intact.      Sensory: Sensation is intact.      Motor: Motor function is intact.      Coordination: Coordination is intact.      Gait: Gait is intact.   Psychiatric:         Mood and Affect: Mood normal.         Behavior: Behavior normal. Behavior is cooperative.         Thought Content: Thought content normal.         Judgment: Judgment normal.       Assessment:    ICD-10-CM    1. Essential hypertension  I10 LIPID PANEL     HEPATIC FUNCTION PANEL     CBC/DIFF     URINALYSIS, MACROSCOPIC AND MICROSCOPIC W/CULTURE REFLEX     BASIC METABOLIC PANEL     THYROID STIMULATING HORMONE (SENSITIVE TSH)     HGA1C (HEMOGLOBIN A1C WITH EST AVG GLUCOSE)      2. Hyperlipidemia, mixed  E78.2       3. Vitamin D deficiency  E55.9 VITAMIN D 25 TOTAL      4. Prediabetes  R73.03 HGA1C (HEMOGLOBIN A1C WITH EST AVG GLUCOSE)      5. 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 Colon cancer screening  Z12.11 Cologuard colon cancer screening      7. Obesity (BMI 35.0-39.9 without comorbidity)  E66.9          Plan:  Orders Placed This Encounter    LIPID PANEL    HEPATIC FUNCTION PANEL    CBC/DIFF    URINALYSIS, MACROSCOPIC AND MICROSCOPIC W/CULTURE REFLEX    BASIC METABOLIC PANEL    THYROID STIMULATING HORMONE (SENSITIVE TSH)    HGA1C (HEMOGLOBIN A1C WITH EST AVG GLUCOSE)    VITAMIN D 25 TOTAL    lisinopriL-hydrochlorothiazide (ZESTORETIC) 10-12.5 mg Oral Tablet    metoprolol succinate (TOPROL-XL) 50  mg Oral Tablet Sustained Release 24 hr    pravastatin (PRAVACHOL) 40 mg Oral Tablet    cefdinir (OMNICEF) 300 mg Oral Capsule    fluconazole (DIFLUCAN) 150 mg Oral Tablet    pantoprazole (PROTONIX) 40 mg Oral Tablet, Delayed Release (E.C.)    ergocalciferol, vitamin D2, (VITAMIN D) 1,250 mcg (50,000 unit)  Oral Capsule    Cologuard colon cancer screening     Today I ordered a Cologuard started the patient on pantoprazole for her gastroesophageal reflux disease plus I recommended that she elevate the head of the bed and not eating after 7:00 p.m. at night.  I refilled her vitamin-D for vitamin-D deficiency because her very low vitamin-D level.  I ordered cefdinir and antibody for her sinusitis along with Diflucan in case she gets yeast infection.  I refilled her lisinopril hydrochlorothiazide for hypertension along with metoprolol for hypertension.  I refilled her pravastatin for mixed hyperlipidemia and I recommended that she stay on a heart healthy low-fat low-cholesterol diet and avoid high fructose corn syrup and concentrated sweets.  I recommend that she try to exercise 30 minutes a day 3 to 5 times a week.  She has gained 4 lb since she was here last.  Today I ordered a lipid panel hepatic function panel CBC with diff urinalysis basic metabolic panel thyroid-stimulating hormone hemoglobin A1c because of the family history of diabetes in her elevated blood sugar.  Also ordered a vitamin-D level.  I recommend she get the magnesium to help a sore vitamin-D.  More than 50% of the visit was spent counseling and coordinating patient care.  All questions were answered to his satisfaction of the patient.    Return in about 6 months (around 05/20/2023).    Mickey Farber, DO     Portions of this note may be dictated using voice recognition software or a dictation service. Variances in spelling and vocabulary are possible and unintentional. Not all errors are caught/corrected. Please notify the [facility] Parkin if any discrepancies  are noted or if the meaning of any statement is not clear.

## 2022-11-21 ENCOUNTER — Other Ambulatory Visit (INDEPENDENT_AMBULATORY_CARE_PROVIDER_SITE_OTHER): Payer: Self-pay

## 2023-01-16 ENCOUNTER — Other Ambulatory Visit: Payer: Self-pay

## 2023-01-16 ENCOUNTER — Ambulatory Visit (INDEPENDENT_AMBULATORY_CARE_PROVIDER_SITE_OTHER): Payer: Medicare Other | Admitting: Family Medicine

## 2023-01-16 ENCOUNTER — Other Ambulatory Visit (HOSPITAL_COMMUNITY): Payer: Medicare Other | Admitting: Family Medicine

## 2023-01-16 ENCOUNTER — Inpatient Hospital Stay
Admission: RE | Admit: 2023-01-16 | Discharge: 2023-01-16 | Disposition: A | Discharge status: Home / Self Care | Payer: Medicare Other | Source: Ambulatory Visit | Attending: Family Medicine | Admitting: Family Medicine

## 2023-01-16 ENCOUNTER — Encounter (INDEPENDENT_AMBULATORY_CARE_PROVIDER_SITE_OTHER): Payer: Self-pay | Admitting: Family Medicine

## 2023-01-16 VITALS — BP 166/89 | HR 65 | Temp 98.0°F | Resp 18 | Ht 66.0 in | Wt 234.0 lb

## 2023-01-16 DIAGNOSIS — M546 Pain in thoracic spine: Secondary | ICD-10-CM | POA: Insufficient documentation

## 2023-01-16 DIAGNOSIS — M4135 Thoracogenic scoliosis, thoracolumbar region: Secondary | ICD-10-CM | POA: Insufficient documentation

## 2023-01-16 DIAGNOSIS — R0981 Nasal congestion: Secondary | ICD-10-CM

## 2023-01-16 DIAGNOSIS — R399 Unspecified symptoms and signs involving the genitourinary system: Secondary | ICD-10-CM | POA: Insufficient documentation

## 2023-01-16 LAB — URINALYSIS, MACROSCOPIC
BILIRUBIN: NEGATIVE mg/dL
BLOOD: NEGATIVE mg/dL
GLUCOSE: NEGATIVE mg/dL
KETONES: NEGATIVE mg/dL
LEUKOCYTES: NEGATIVE WBCs/uL
NITRITE: NEGATIVE
PH: 7 (ref 5.0–9.0)
PROTEIN: NEGATIVE mg/dL
SPECIFIC GRAVITY: 1.025 (ref 1.002–1.030)
UROBILINOGEN: NORMAL mg/dL

## 2023-01-16 LAB — URINALYSIS, MICROSCOPIC
BACTERIA: NEGATIVE /hpf
RBCS: 2 /hpf (ref <4)
SQUAMOUS EPITHELIAL: <1 /hpf (ref <28)
WBCS: <1 /hpf (ref <6)

## 2023-01-16 MED ORDER — SULFAMETHOXAZOLE 800 MG-TRIMETHOPRIM 160 MG TABLET
1.0000 | ORAL_TABLET | Freq: Two times a day (BID) | ORAL | 0 refills | Status: DC
Start: 2023-01-16 — End: 2023-06-25

## 2023-01-16 NOTE — Progress Notes (Signed)
FAMILY MEDICINE, MEDICAL OFFICE BUILDING  [address]  [place] [address]       Name: Barbara Park MRN:  [id number]   Date: 01/16/2023 Age: 67 y.o.          Provider: Mickey Farber, DO    Reason for visit: Back Pain and Urinary Frequency      History of Present Illness:  01/16/2023:  This 67 year old female presents with sinus pressure and headache back pain right-sided back pain across her ribs 4 on a scale of 1-10 urinary frequency and strong odor requesting a urine specimen be collected she has been taking Motrin and Naprosyn yesterday got worse she did take some baclofen but only 5 mg tablet and it did not seem to help very much.  She saw Caleb in February and x-ray of the dorsal spine was done which showed dextroscoliosis and some degenerative changes she had physical therapy 2 times a week for 6 weeks.  Continues to do exercises at home but the back pain came back around 1 month ago.  She has right costovertebral angle tenderness he is going to wear rib belt in his requesting an MRI of the dorsal spine and further x-rays of her ribs.  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 Alwyn Ren         Allergies:  Allergies   Allergen Reactions    Augmentin [Amoxicillin-Pot Clavulanate] Nausea/ Vomiting     Medications:  Current Outpatient Medications   Medication Sig    ascorbic acid (VITAMIN C) 1,000 mg Oral Tablet Take 1 Tablet (1,000 mg total) by  mouth Once a day    B.animalis,bifid,infantis,Kirsi Hugh (PROBIOTIC 4X ORAL) Take by mouth    ergocalciferol, vitamin D2, (VITAMIN D) 1,250 mcg (50,000 unit) Oral Capsule Take 1 Capsule (50,000 Units total) by mouth Every 7 days for 180 days    lisinopriL-hydrochlorothiazide (ZESTORETIC) 10-12.5 mg Oral Tablet Take 1 Tablet by mouth Once a day for 180 days    metoprolol succinate (TOPROL-XL) 50 mg Oral Tablet Sustained Release 24 hr One a day    multivitamin with folic acid (THERA) 400 mcg Oral Tablet Take 1 Tablet by mouth Once a day    pantoprazole (PROTONIX) 40 mg Oral Tablet, Delayed Release (E.C.) Take 1 Tablet (40 mg total) by mouth Once a day Indications: indigestion    pravastatin (PRAVACHOL) 40 mg Oral Tablet Take 1 Tablet (40 mg total) by mouth Once a day for 180 days    trimethoprim-sulfamethoxazole (BACTRIM DS) 160-800mg  per tablet Take 1 Tablet (160 mg total) by mouth Twice daily  Family History:  Family Medical History:       Problem Relation (Age of Onset)    Breast Cancer Mother (15), Maternal Grandmother    Congestive Heart Failure Father    Diabetes Mother    Hypertension (High Blood Pressure) Mother, Father    No Known Problems Sister, Brother, Maternal Grandfather, Paternal Grandmother, Paternal Grandfather, Daughter, Son, Maternal Aunt, Maternal Uncle, Paternal Aunt, Paternal Uncle, Other            Social History:  Social History     Socioeconomic History    Marital status: Married   Tobacco Use    Smoking status: Never    Smokeless tobacco: Never   Vaping Use    Vaping status: Never Used   Substance and Sexual Activity    Alcohol use: Never    Drug use: Never           Review of Systems:  Any pertinent Review of Systems as addressed in the HPI above.    Physical Exam:  Vital Signs:  Vitals:    01/16/23 1134   BP: (!) 166/89   Pulse: 65   Resp: 18   Temp: 36.7 C (98 F)   TempSrc: Temporal   SpO2: 98%   Weight: 106 kg (234 lb)   Height: 1.676 m (5\' 6" )   BMI: 37.85     Physical Exam  Vitals and  nursing note reviewed.   Constitutional:       General: She is awake.      Appearance: Normal appearance. She is well-developed and well-groomed. She is obese.   HENT:      Head: Normocephalic and atraumatic.      Right Ear: Tympanic membrane, ear canal and external ear normal.      Left Ear: Tympanic membrane, ear canal and external ear normal.      Nose: Nose normal.      Mouth/Throat:      Mouth: Mucous membranes are moist.      Pharynx: Oropharynx is clear.   Eyes:      Extraocular Movements: Extraocular movements intact.      Conjunctiva/sclera: Conjunctivae normal.      Pupils: Pupils are equal, round, and reactive to light.   Cardiovascular:      Rate and Rhythm: Normal rate and regular rhythm.      Heart sounds: Normal heart sounds, S1 normal and S2 normal.   Pulmonary:      Effort: Pulmonary effort is normal.      Breath sounds: Normal breath sounds.   Abdominal:      General: Abdomen is flat. Bowel sounds are normal.      Palpations: Abdomen is soft.      Tenderness: There is right CVA tenderness.   Musculoskeletal:      Cervical back: Normal range of motion and neck supple.      Thoracic back: Spasms, tenderness and bony tenderness present. Decreased range of motion. Scoliosis present.   Skin:     General: Skin is warm and dry.      Capillary Refill: Capillary refill takes less than 2 seconds.   Neurological:      General: No focal deficit present.      Mental Status: She is alert and oriented to person, place, and time. Mental status is at baseline.   Psychiatric:         Mood and Affect: Mood normal.         Behavior: Behavior normal. Behavior is  cooperative.         Thought Content: Thought content normal.         Judgment: Judgment normal.       Assessment:    ICD-10-CM    1. UTI symptoms  R39.9 URINALYSIS, MACROSCOPIC AND MICROSCOPIC W/CULTURE REFLEX      2. Thoracic back pain  M54.6 MRI SPINE THORACIC WO CONTRAST     XR RIBS RIGHT W PA/AP CHEST     MRI SPINE THORACIC WO CONTRAST      3. Thoracogenic  scoliosis of thoracolumbar region  M41.35 MRI SPINE THORACIC WO CONTRAST     XR RIBS RIGHT W PA/AP CHEST     MRI SPINE THORACIC WO CONTRAST         Plan:  Orders Placed This Encounter    MRI SPINE THORACIC WO CONTRAST    XR RIBS RIGHT W PA/AP CHEST    MRI SPINE THORACIC WO CONTRAST    URINALYSIS, MACROSCOPIC AND MICROSCOPIC W/CULTURE REFLEX    URINALYSIS, MACROSCOPIC    URINALYSIS, MICROSCOPIC    trimethoprim-sulfamethoxazole (BACTRIM DS) 160-800mg  per tablet     For her urinary tract infection and nasal sinus congestion the patient requested a Bactrim DS.  We sent the urine for culture.  We are going to order an MRI of the thoracic spine at [facility] because she is very claustrophobic.  We are also going to x-ray her right ribs to rule out a rib fracture.  I think the patient would do better in regular physical therapy longer.  I will see her back in a month and she should continue her Naprosyn and Motrin as well as take the baclofen when she is in pain and maybe needs to double up on the baclofen.  If she continues to have pain I think we should get an ultrasound of her right upper quadrant although she has no symptoms related to cholecystitis or biliary dyskinesia.  More than 50% of the visit was spent counseling coordinating care.  All questions were answered to the satisfaction of the patient.    No follow-ups on file.    Mickey Farber, DO     Portions of this note may be dictated using voice recognition software or a dictation service. Variances in spelling and vocabulary are possible and unintentional. Not all errors are caught/corrected. Please notify the There[facility] Parkin if any discrepancies are noted or if the meaning of any statement is not clear.

## 2023-01-31 IMAGING — MR MRI THORACIC SPINE WITHOUT CONTRAST
4 of 5 series · 26 of 48 positions shown · non-contrast
Comparison: None available.

﻿EXAM:  [id number]   MRI THORACIC SPINE WITHOUT CONTRAST
INDICATION: Mid back pain.
TECHNIQUE: Noncontrast multiplanar, multisequence MRI was performed.

[Series 5: T2 · sagittal · 4.5mm · 0.78mm/px · 8 of 13 slices shown (1 of 2)]
[im 1/13]
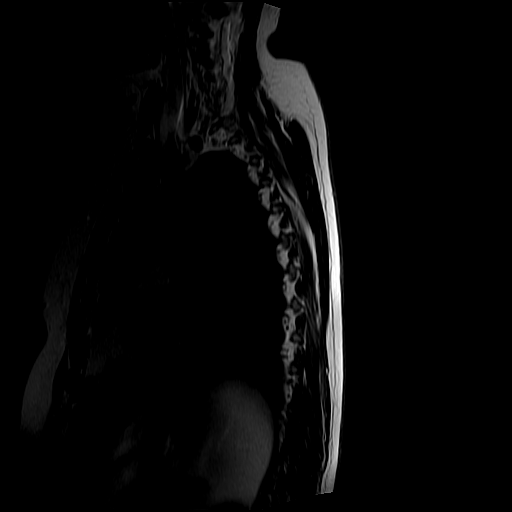
[im 2/13]
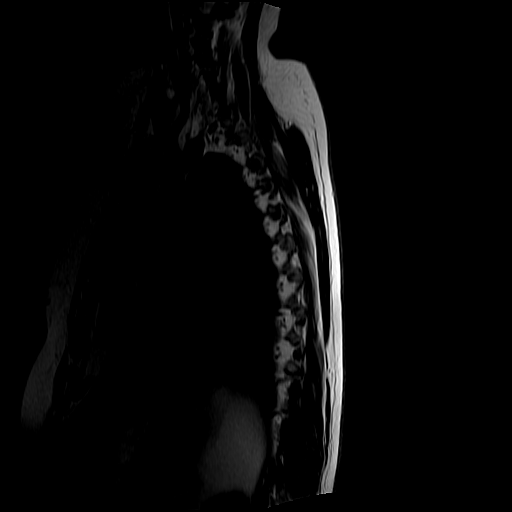
[im 5/13]
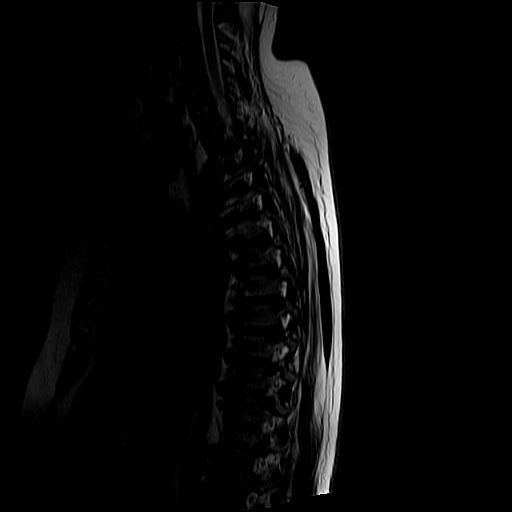
[im 6/13]
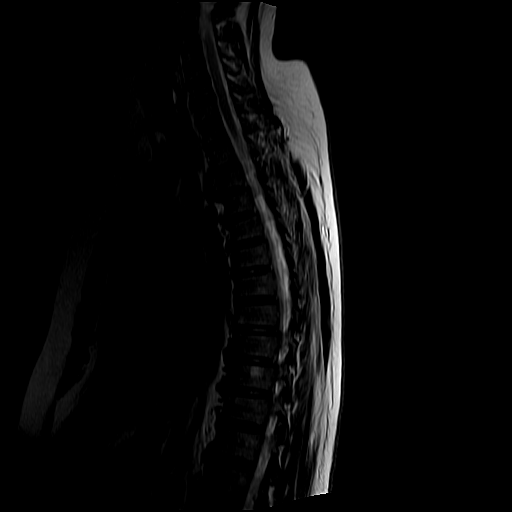
[im 7/13]
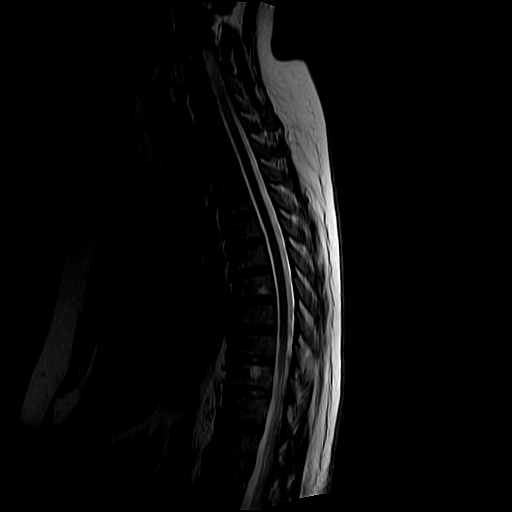
[im 9/13]
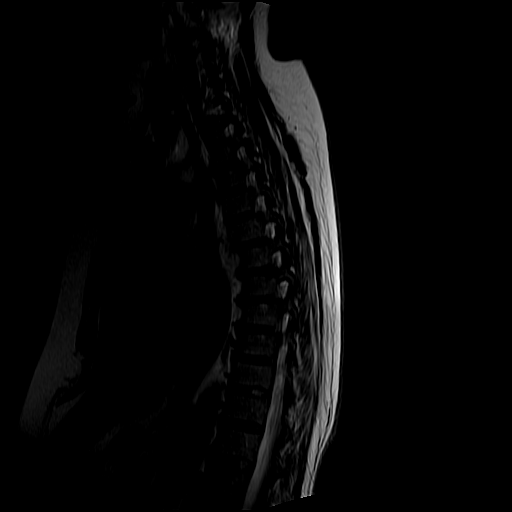
[im 11/13]
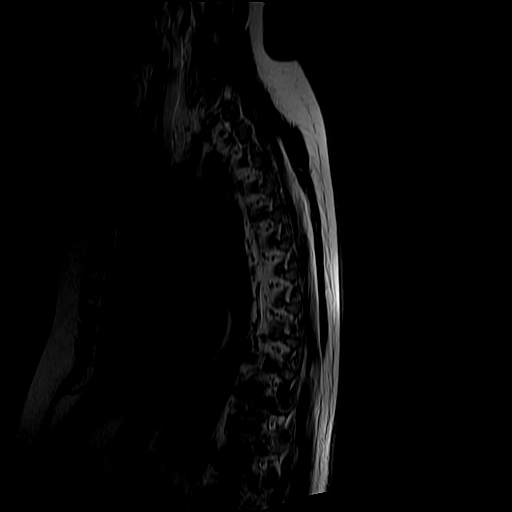
[im 13/13]
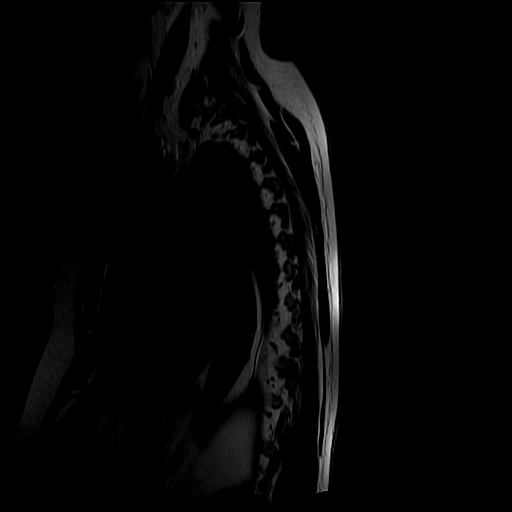

[Series 6: T1 · sagittal · 4.5mm · 0.78mm/px · 7 of 13 slices shown (1 of 2)]
[im 1/13]
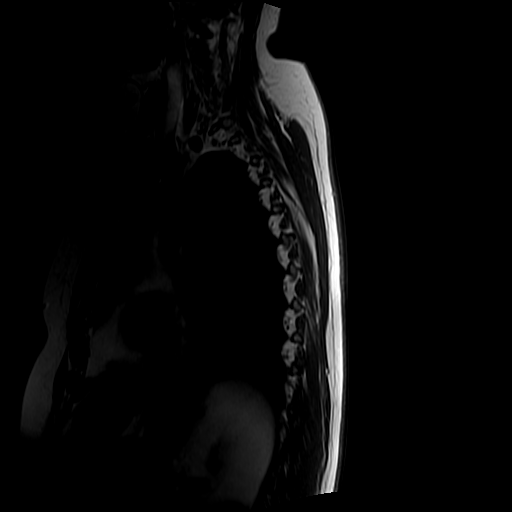
[im 2/13]
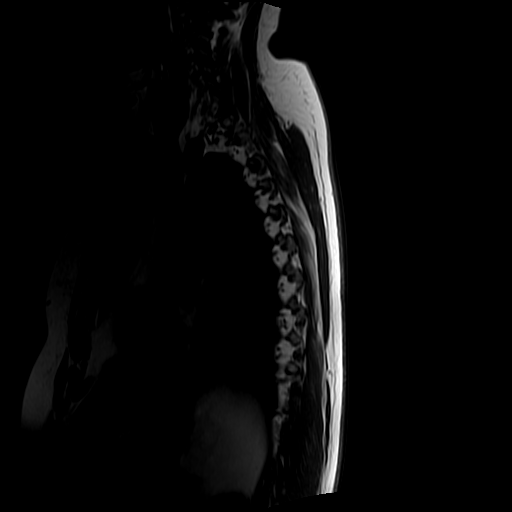
[im 4/13]
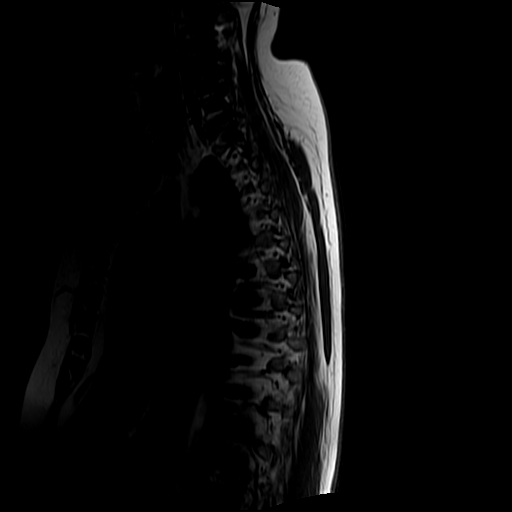
[im 5/13]
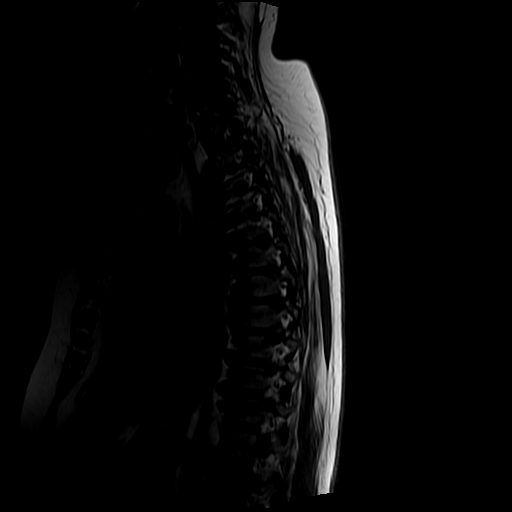
[im 7/13]
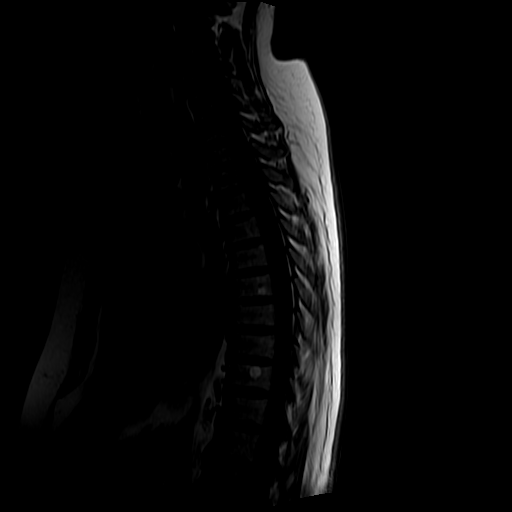
[im 8/13]
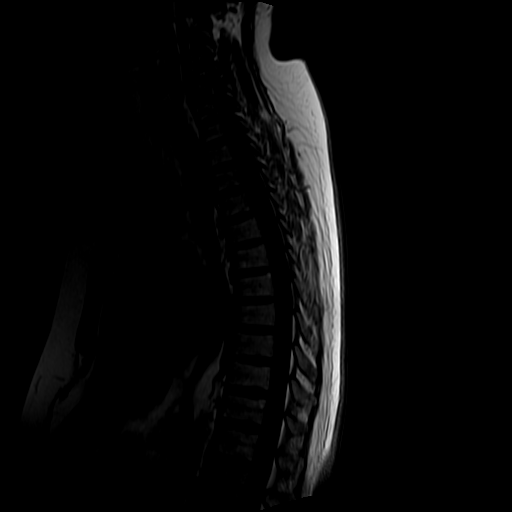
[im 11/13]
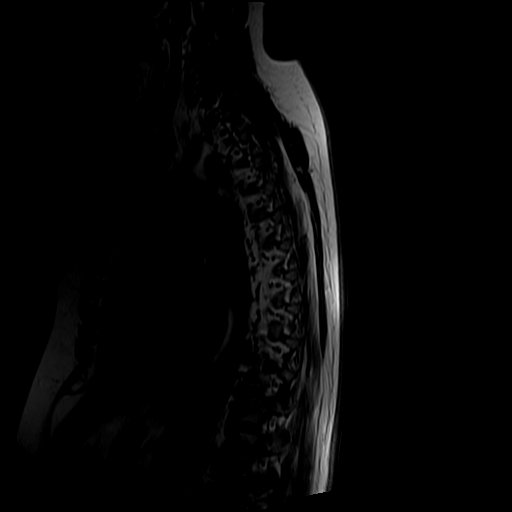

[Series 8: T2 · axial · 4.0mm · 0.62mm/px · z∈[-250,-55]mm · 8 of 15 slices shown (2 of 2)]
[im 1/15]
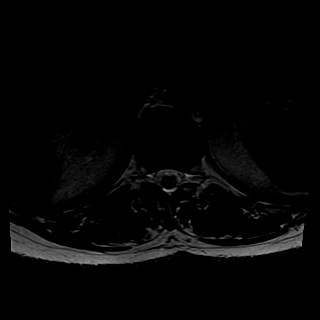
[im 2/15]
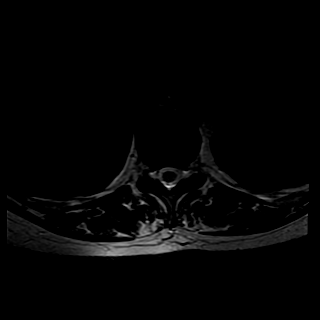
[im 5/15]
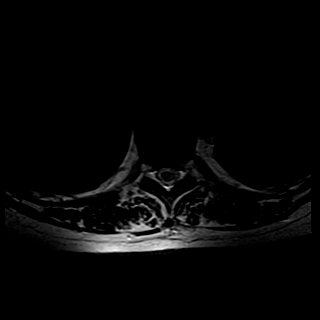
[im 7/15]
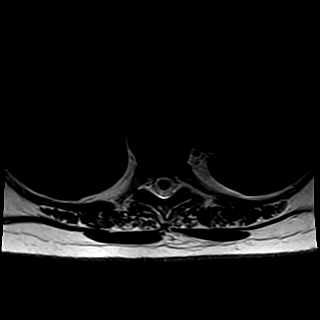
[im 8/15]
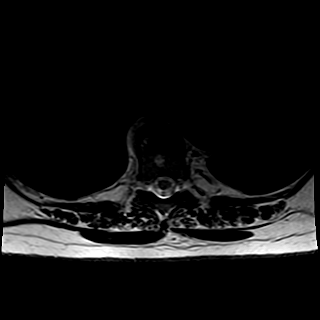
[im 10/15]
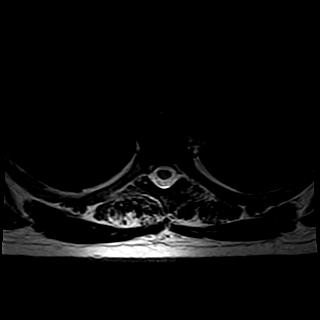
[im 13/15]
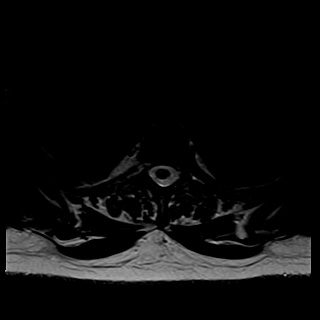
[im 15/15]
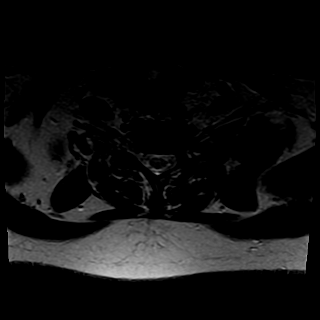

[Series 9: T1 · axial · 4.0mm · 0.62mm/px · z∈[-229,-91]mm · 3 of 15 slices shown (2 of 2)]
[im 2/15]
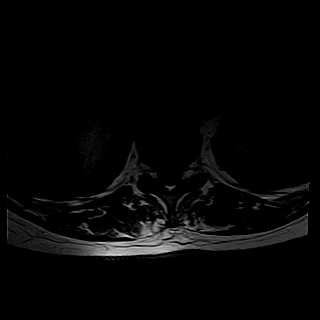
[im 8/15]
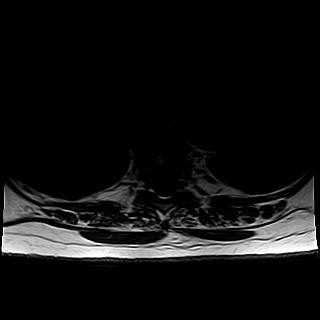
[im 13/15]
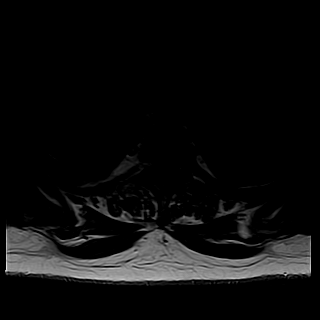

[26 of 48 positions shown; findings below may reference images not displayed]

FINDINGS: There are mild degenerative changes at multiple levels with slight disc space narrowing, disc desiccation, and small osteophyte formation.  

A few small hemangiomata are noted.  There is no fracture, malalignment, or significant marrow signal alteration.  

The spinal cord appears unremarkable.  There is no disc herniation or spinal stenosis.
IMPRESSION: 1. Mild degenerative changes.

2. No fracture or disc herniation is seen.

## 2023-02-06 ENCOUNTER — Encounter (INDEPENDENT_AMBULATORY_CARE_PROVIDER_SITE_OTHER): Payer: Self-pay | Admitting: Family Medicine

## 2023-02-06 ENCOUNTER — Encounter (INDEPENDENT_AMBULATORY_CARE_PROVIDER_SITE_OTHER): Payer: Self-pay

## 2023-02-06 NOTE — Nursing Note (Signed)
Per vo Dr Jacqulyn Bath: Let patient know her MRI shows mild degenerative changes. No fracture or disc herniation seen.

## 2023-02-06 NOTE — Nursing Note (Signed)
Letter mailed

## 2023-02-08 ENCOUNTER — Other Ambulatory Visit (INDEPENDENT_AMBULATORY_CARE_PROVIDER_SITE_OTHER): Payer: Self-pay | Admitting: Family Medicine

## 2023-02-08 DIAGNOSIS — M546 Pain in thoracic spine: Secondary | ICD-10-CM

## 2023-02-21 ENCOUNTER — Other Ambulatory Visit: Payer: Self-pay

## 2023-02-21 ENCOUNTER — Ambulatory Visit (INDEPENDENT_AMBULATORY_CARE_PROVIDER_SITE_OTHER): Payer: Medicare Other

## 2023-02-21 DIAGNOSIS — Z23 Encounter for immunization: Secondary | ICD-10-CM

## 2023-03-14 ENCOUNTER — Other Ambulatory Visit (INDEPENDENT_AMBULATORY_CARE_PROVIDER_SITE_OTHER): Payer: Self-pay | Admitting: Family Medicine

## 2023-05-24 ENCOUNTER — Encounter (INDEPENDENT_AMBULATORY_CARE_PROVIDER_SITE_OTHER): Payer: Self-pay | Admitting: Family Medicine

## 2023-06-08 ENCOUNTER — Encounter (INDEPENDENT_AMBULATORY_CARE_PROVIDER_SITE_OTHER): Payer: Medicare Other | Admitting: Family Medicine

## 2023-06-15 ENCOUNTER — Encounter (INDEPENDENT_AMBULATORY_CARE_PROVIDER_SITE_OTHER): Payer: Self-pay

## 2023-06-22 ENCOUNTER — Ambulatory Visit (INDEPENDENT_AMBULATORY_CARE_PROVIDER_SITE_OTHER): Payer: Medicare Other

## 2023-06-22 ENCOUNTER — Other Ambulatory Visit: Payer: Self-pay

## 2023-06-22 ENCOUNTER — Other Ambulatory Visit: Payer: Medicare Other | Attending: Family Medicine | Admitting: Family Medicine

## 2023-06-22 DIAGNOSIS — R7303 Prediabetes: Secondary | ICD-10-CM | POA: Insufficient documentation

## 2023-06-22 DIAGNOSIS — I10 Essential (primary) hypertension: Secondary | ICD-10-CM

## 2023-06-22 DIAGNOSIS — E559 Vitamin D deficiency, unspecified: Secondary | ICD-10-CM | POA: Insufficient documentation

## 2023-06-22 LAB — URINALYSIS, MACROSCOPIC
BILIRUBIN: NEGATIVE mg/dL
BLOOD: 0.03 mg/dL
GLUCOSE: NEGATIVE mg/dL
KETONES: NEGATIVE mg/dL
LEUKOCYTES: NEGATIVE WBCs/uL
NITRITE: NEGATIVE
PH: 5.5 (ref 5.0–9.0)
PROTEIN: 10 mg/dL
SPECIFIC GRAVITY: 1.023 (ref 1.002–1.030)
UROBILINOGEN: NORMAL mg/dL

## 2023-06-22 LAB — HEPATIC FUNCTION PANEL
ALBUMIN/GLOBULIN RATIO: 1.4 (ref 0.8–1.4)
ALBUMIN: 4.2 g/dL (ref 3.5–5.7)
ALKALINE PHOSPHATASE: 54 U/L (ref 34–104)
ALT (SGPT): 44 U/L (ref 7–52)
AST (SGOT): 38 U/L (ref 13–39)
BILIRUBIN DIRECT: 0.09 md/dL (ref 0.03–0.18)
BILIRUBIN TOTAL: 0.5 mg/dL (ref 0.3–1.0)
BILIRUBIN, INDIRECT: 0.41 mg/dL (ref <=1)
GLOBULIN: 2.9 (ref 2.0–3.5)
PROTEIN TOTAL: 7.1 g/dL (ref 6.4–8.9)

## 2023-06-22 LAB — CBC WITH DIFF
BASOPHIL #: 0.1 10*3/uL (ref 0.00–0.10)
BASOPHIL %: 1 % (ref 0–1)
EOSINOPHIL #: 0.4 10*3/uL (ref 0.00–0.50)
EOSINOPHIL %: 8 % — ABNORMAL HIGH (ref 1–7)
HCT: 45.2 % — ABNORMAL HIGH (ref 31.2–41.9)
HGB: 15.4 g/dL — ABNORMAL HIGH (ref 10.9–14.3)
LYMPHOCYTE #: 2.3 10*3/uL (ref 1.10–3.10)
LYMPHOCYTE %: 44 % (ref 16–46)
MCH: 30.9 pg (ref 24.7–32.8)
MCHC: 34.1 g/dL (ref 32.3–35.6)
MCV: 90.6 fL (ref 75.5–95.3)
MONOCYTE #: 0.5 10*3/uL (ref 0.20–0.90)
MONOCYTE %: 10 % (ref 4–11)
MPV: 9.6 fL (ref 7.9–10.8)
NEUTROPHIL #: 2 10*3/uL (ref 1.90–8.20)
NEUTROPHIL %: 37 % — ABNORMAL LOW (ref 43–77)
PLATELETS: 166 10*3/uL (ref 140–440)
RBC: 4.99 10*6/uL — ABNORMAL HIGH (ref 3.63–4.92)
RDW: 12.8 % (ref 12.3–17.7)
WBC: 5.3 10*3/uL (ref 3.8–11.8)

## 2023-06-22 LAB — BASIC METABOLIC PANEL
ANION GAP: 5 mmol/L (ref 4–13)
BUN/CREA RATIO: 16 (ref 6–22)
BUN: 12 mg/dL (ref 7–25)
CALCIUM: 9.3 mg/dL (ref 8.6–10.3)
CHLORIDE: 105 mmol/L (ref 98–107)
CO2 TOTAL: 32 mmol/L — ABNORMAL HIGH (ref 21–31)
CREATININE: 0.77 mg/dL (ref 0.60–1.30)
ESTIMATED GFR: 85 mL/min/{1.73_m2} (ref >59)
GLUCOSE: 117 mg/dL — ABNORMAL HIGH (ref 74–109)
OSMOLALITY, CALCULATED: 284 mosm/kg (ref 270–290)
POTASSIUM: 4 mmol/L (ref 3.5–5.1)
SODIUM: 142 mmol/L (ref 136–145)

## 2023-06-22 LAB — URINALYSIS, MICROSCOPIC: SQUAMOUS EPITHELIAL: 1 /[HPF] (ref <28)

## 2023-06-22 LAB — LIPID PANEL
CHOL/HDL RATIO: 2.9
CHOLESTEROL: 142 mg/dL (ref <200)
HDL CHOL: 49 mg/dL (ref >=40)
LDL CALC: 71 mg/dL (ref 0–100)
TRIGLYCERIDES: 110 mg/dL (ref <=150)
VLDL CALC: 22 mg/dL (ref 0–50)

## 2023-06-22 LAB — HGA1C (HEMOGLOBIN A1C WITH EST AVG GLUCOSE): HEMOGLOBIN A1C: 6 % (ref 4.0–6.0)

## 2023-06-22 LAB — THYROID STIMULATING HORMONE (SENSITIVE TSH): TSH: 1.708 u[IU]/mL (ref 0.450–5.330)

## 2023-06-22 LAB — VITAMIN D 25 TOTAL: VITAMIN D 25, TOTAL: 26.95 ng/mL — ABNORMAL LOW (ref 30.00–100.00)

## 2023-06-25 ENCOUNTER — Ambulatory Visit (INDEPENDENT_AMBULATORY_CARE_PROVIDER_SITE_OTHER): Payer: Medicare Other

## 2023-06-25 ENCOUNTER — Encounter (INDEPENDENT_AMBULATORY_CARE_PROVIDER_SITE_OTHER): Payer: Self-pay

## 2023-06-25 ENCOUNTER — Other Ambulatory Visit: Payer: Self-pay

## 2023-06-25 VITALS — BP 156/82 | HR 85 | Temp 97.8°F | Resp 16 | Ht 66.5 in | Wt 232.0 lb

## 2023-06-25 DIAGNOSIS — Z13820 Encounter for screening for osteoporosis: Secondary | ICD-10-CM

## 2023-06-25 DIAGNOSIS — E559 Vitamin D deficiency, unspecified: Secondary | ICD-10-CM

## 2023-06-25 DIAGNOSIS — Z6836 Body mass index (BMI) 36.0-36.9, adult: Secondary | ICD-10-CM

## 2023-06-25 DIAGNOSIS — R7303 Prediabetes: Secondary | ICD-10-CM

## 2023-06-25 DIAGNOSIS — Z1159 Encounter for screening for other viral diseases: Secondary | ICD-10-CM

## 2023-06-25 DIAGNOSIS — Z7189 Other specified counseling: Secondary | ICD-10-CM

## 2023-06-25 DIAGNOSIS — E669 Obesity, unspecified: Secondary | ICD-10-CM

## 2023-06-25 DIAGNOSIS — E782 Mixed hyperlipidemia: Secondary | ICD-10-CM

## 2023-06-25 DIAGNOSIS — Z0000 Encounter for general adult medical examination without abnormal findings: Secondary | ICD-10-CM

## 2023-06-25 DIAGNOSIS — I10 Essential (primary) hypertension: Secondary | ICD-10-CM

## 2023-06-25 DIAGNOSIS — Z780 Asymptomatic menopausal state: Secondary | ICD-10-CM

## 2023-06-25 DIAGNOSIS — Z23 Encounter for immunization: Secondary | ICD-10-CM

## 2023-06-25 MED ORDER — PRAVASTATIN 40 MG TABLET
40.0000 mg | ORAL_TABLET | Freq: Every day | ORAL | 1 refills | Status: AC
Start: 2023-06-25 — End: 2023-12-22

## 2023-06-25 MED ORDER — METOPROLOL SUCCINATE ER 50 MG TABLET,EXTENDED RELEASE 24 HR
ORAL_TABLET | ORAL | 1 refills | Status: DC
Start: 2023-06-25 — End: 2024-03-21

## 2023-06-25 MED ORDER — PANTOPRAZOLE 40 MG TABLET,DELAYED RELEASE
40.0000 mg | DELAYED_RELEASE_TABLET | Freq: Every day | ORAL | 1 refills | Status: AC
Start: 2023-06-25 — End: ?

## 2023-06-25 MED ORDER — ERGOCALCIFEROL (VITAMIN D2) 1,250 MCG (50,000 UNIT) CAPSULE
50000.0000 [IU] | ORAL_CAPSULE | ORAL | 1 refills | Status: AC
Start: 2023-06-25 — End: ?

## 2023-06-25 MED ORDER — BOOSTRIX TDAP 2.5 LF UNIT-8 MCG-5 LF/0.5 ML INTRAMUSCULAR SYRINGE
0.5000 mL | INJECTION | Freq: Once | INTRAMUSCULAR | 0 refills | Status: AC
Start: 2023-06-25 — End: 2023-06-25

## 2023-06-25 MED ORDER — LISINOPRIL 10 MG-HYDROCHLOROTHIAZIDE 12.5 MG TABLET
1.0000 | ORAL_TABLET | Freq: Every day | ORAL | 1 refills | Status: AC
Start: 2023-06-25 — End: 2023-12-22

## 2023-06-25 NOTE — Assessment & Plan Note (Signed)
Lab Results   Component Value Date    TRIG 110 06/22/2023    HDLCHOL 49 06/22/2023    LDLCHOL 71 06/22/2023    CHOLESTEROL 142 06/22/2023   Lipid panel WNL. Pravastatin refill order placed.

## 2023-06-25 NOTE — Assessment & Plan Note (Signed)
Lab Results   Component Value Date    VITD 26 (L) 11/10/2022   Patient denies compliance with weekly vitamin D supplement, stating that she frequently forgets to take it. Refill order placed and patient advised on the importance of following prescription as written.

## 2023-06-25 NOTE — Nursing Note (Signed)
06/25/23 0926   Comprehensive Health Assessment-Adult   Do you wish to complete this form? Yes   During the past 4 weeks, how would you rate your health in general? Good   During the past 4 weeks, how much difficulty have you had doing your usual activities inside and outside your home because of medical or emotional problems? No difficulty at all   During the past 4 weeks, was someone available to help you if you needed and wanted help? Yes, as much as I wanted   In the past year, how many times have you gone to the emergency department or been admitted to a hospital for a health problem? None   Are you generally satisfied with your sleep? Yes   Do you have enough money to buy things you need in everyday life, such as food, clothing, medicines, and housing? Yes, always   Can you get to places beyond walking distance without help?  (For example, can you drive your own car or travel alone on buses)? Yes   Do you fasten your seatbelt when you are in a car? Yes, usually   Do you exercise 20 minutes 3 or more days per week (such as walking, dancing, biking, mowing grass, swimming)? Yes, most of the time   How often do you eat food that is healthy (fruits, vegetables, lean meats) instead of unhealthy (sweets, fast food, junk food, fatty foods)? Most of the time   Have your parents, brothers or sisters had any of the following problems before the age of 60? (check all that apply) Heart problems, or hardening of the arteries;High cholesterol;Diabetes (sugar);Mental health problems such as depression, bipolar, severe anxiety, postpartum depression;Cancer   How often do you have trouble taking medicines the eay you are told to take them? I always take them as prescribed   Do you need any help communicating with your doctors and nurses because of vision or hearing problems? No   During the past 12 months, have you experienced confusion or memory loss that is happening more often or is getting worse? No   Do you have one  person you think of as your personal doctor (primary care provider or family doctor)? Yes   If you are seeing a Primary Care Provider (PCP) or family doctor. please list their name Dr long   Are you now also seeing any specialist physician(s) (such as eye doctor, foot doctor, skin doctor)? Yes   If you are seeing a specialist for anything such as foot, eye, skin, etc.  please list their name(s) Dr Elesa Massed   How confident are you that you can control or manage most of your health problems? Very confident

## 2023-06-25 NOTE — Nursing Note (Signed)
06/25/23 0926   Depression Screen   Little interest or pleasure in doing things. 0   Feeling down, depressed, or hopeless 0   PHQ 2 Total 0

## 2023-06-25 NOTE — Progress Notes (Signed)
FAMILY MEDICINE, MEDICAL OFFICE BUILDING  [address]  [place] [address]    Medicare Annual Wellness Visit    Name: Barbara Park MRN:  [id number]   Date: 06/25/2023 Age: 68 y.o.     SUBJECTIVE:   Barbara Park is a 68 y.o. female for presenting for Medicare Wellness exam.   I have reviewed and reconciled the medication list with the patient today.        06/25/2023     9:26 AM   Comprehensive Health Assessment-Adult   Do you wish to complete this form? Yes   During the past 4 weeks, how would you rate your health in general? Good   During the past 4 weeks, how much difficulty have you had doing your usual activities inside and outside your home because of medical or emotional problems? No difficulty at all   During the past 4 weeks, was someone available to help you if you needed and wanted help? Yes, as much as I wanted   In the past year, how many times have you gone to the emergency department or been admitted to a hospital for a health problem? None   Are you generally satisfied with your sleep? Yes   Do you have enough money to buy things you need in everyday life, such as food, clothing, medicines, and housing? Yes, always   Can you get to places beyond walking distance without help?  (For example, can you drive your own car or travel alone on buses)? Yes   Do you fasten your seatbelt when you are in a car? Yes, usually   Do you exercise 20 minutes 3 or more days per week (such as walking, dancing, biking, mowing grass, swimming)? Yes, most of the time   How often do you eat food that is healthy (fruits, vegetables, lean meats) instead of unhealthy (sweets, fast food, junk food, fatty foods)? Most of the time   Have your parents, brothers or sisters had any of the following problems before the age of 60? (check all that apply) Heart problems, or hardening of the arteries;High cholesterol;Diabetes (sugar);Mental health problems such as depression, bipolar, severe anxiety, postpartum depression;Cancer    How often do you have trouble taking medicines the eay you are told to take them? I always take them as prescribed   Do you need any help communicating with your doctors and nurses because of vision or hearing problems? No   During the past 12 months, have you experienced confusion or memory loss that is happening more often or is getting worse? No   Do you have one person you think of as your personal doctor (primary care provider or family doctor)? Yes   If you are seeing a Primary Care Provider (PCP) or family doctor. please list their name Dr long   Are you now also seeing any specialist physician(s) (such as eye doctor, foot doctor, skin doctor)? Yes   If you are seeing a specialist for anything such as foot, eye, skin, etc.  please list their name(s) Dr Elesa Massed   How confident are you that you can control or manage most of your health problems? Very confident     I have reviewed and updated as appropriate the past medical, family and social history. 06/25/2023 as summarized below:  Past Medical History:   Diagnosis Date    Cancer ([facility])     oral ca 2017    Essential hypertension     Fatty liver  GERD (gastroesophageal reflux disease)     Gilbert's disease     HLD (hyperlipidemia)     Insomnia     Palpitations     Palpitations     Rosacea     Sinus bradycardia      Past Surgical History:   Procedure Laterality Date    Cesarean section      Colonoscopy      Hx dilation and curettage      Mouth surgery      Skin cancer excision       Current Outpatient Medications   Medication Sig    ascorbic acid (VITAMIN C) 1,000 mg Oral Tablet Take 1 Tablet (1,000 mg total) by mouth Once a day    B.animalis,bifid,infantis,long (PROBIOTIC 4X ORAL) Take by mouth    diphtheria,pertussis-acell,tetanus (BOOSTRIX TDAP) 2.5-8.5Lf-mcg-Lf/0.49mL IM suspension Inject 0.5 mL into the muscle One time for 1 dose    lisinopriL-hydrochlorothiazide (ZESTORETIC) 10-12.5 mg Oral Tablet TAKE 1 TABLET BY MOUTH ONCE A DAY FOR 180 DAYS     metoprolol succinate (TOPROL-XL) 50 mg Oral Tablet Sustained Release 24 hr TAKE 1 TABLET BY MOUTH EVERY DAY    multivitamin with folic acid (THERA) 400 mcg Oral Tablet Take 1 Tablet by mouth Once a day    pantoprazole (PROTONIX) 40 mg Oral Tablet, Delayed Release (E.C.) Take 1 Tablet (40 mg total) by mouth Once a day Indications: indigestion (Patient not taking: Reported on 06/25/2023)    pravastatin (PRAVACHOL) 40 mg Oral Tablet Take 1 Tablet (40 mg total) by mouth Once a day for 180 days     Family Medical History:       Problem Relation (Age of Onset)    Breast Cancer Mother (25), Maternal Grandmother    Congestive Heart Failure Father    Diabetes Mother    Hypertension (High Blood Pressure) Mother, Father    No Known Problems Sister, Brother, Maternal Grandfather, Paternal Grandmother, Paternal Grandfather, Daughter, Son, Maternal Aunt, Maternal Uncle, Paternal Aunt, Paternal Uncle, Other          Social History     Socioeconomic History    Marital status: Married   Tobacco Use    Smoking status: Never    Smokeless tobacco: Never   Vaping Use    Vaping status: Never Used   Substance and Sexual Activity    Alcohol use: Never    Drug use: Never     Social Determinants of Health     Health Literacy: Low Risk  (06/25/2023)    Health Literacy     SDOH Health Literacy: Never       List of Current Health Care Providers   Care Team       PCP       Name Type Specialty Phone Number    Mickey Farber, DO Physician [facility] [phone]              Care Team       No care team found                      Health Maintenance   Topic Date Due    Osteoporosis screening  Never done    Hepatitis C screening  Never done    Adult Tdap-Td (1 - Tdap) Never done    Colonoscopy  12/23/2023 (Originally 08/05/2001)    Shingles Vaccine (1 of 2) 06/24/2024 (Originally 08/06/2006)    Pneumococcal Vaccination, Age 14+ (1 of 1 - PCV) 06/24/2024 (Originally  08/06/2006)    Depression Screening  06/24/2024    Breast Cancer Screening   10/17/2024    Influenza Vaccine  Completed    Medicare Annual Wellness Visit - Calendar Year Insurers  Completed    Hepatitis B Vaccine  Discontinued    Covid-19 Vaccine  Discontinued     Medicare Wellness Assessment   Medicare initial or wellness physical in the last year?: Yes  Advance Directives   Does patient have a living will or MPOA: Yes   Has patient provided [employer] with a copy?: No   Patient advised to bring in copy.: Yes          Activities of Daily Living   Do you need help with dressing, bathing, or walking?: No   Do you need help with shopping, housekeeping, medications, or finances?: No   Do you have rugs in hallways, broken steps, or poor lighting?: No   Do you have grab bars in your bathroom, non-slip strips in your tub, and hand rails on your stairs?: Yes   Cognitive Function Screen (1=Yes, 0=No)   What is you age?: Correct   What is the time to the nearest hour?: Correct   What is the year?: Correct   What is the name of this clinic?: Correct   Can the patient recognize two persons (the doctor, the nurse, home help, etc.)?: Correct   What is the date of your birth? (day and month sufficient) : Correct   In what year did World War II end?: Incorrect   Who is the current president of the [place]?: Correct   Count from 20 down to 1?: Correct   What address did I give you earlier?: Correct   Total Score: 9   Interpretation of Total Score: Greater than 6 Normal   Fall Risk Screen   Do you feel unsteady when standing or walking?: No  Do you worry about falling?: No  Have you fallen in the past year?: No   Depression Screen     Little interest or pleasure in doing things.: Not at all  Feeling down, depressed, or hopeless: Not at all  PHQ 2 Total: 0     Pain Score   Pain Score:   0 - No pain    Substance Use-Abuse Screening     Tobacco Use     In Past 12 MONTHS, how often have you used any tobacco product (for example, cigarettes, e-cigarettes, cigars, pipes, or smokeless tobacco)?: Never      Alcohol use     In the PAST 12 MONTHS, how often have you had 5 (men)/4 (women) or more drinks containing alcohol in one day?: Never     Prescription Drug Use     In the PAST 12 months, how often have you used any prescription medications just for the feeling, more than prescribed, or that were not prescribed for you? Prescriptions may include: opioids, benzodiazepines, medications for ADHD: Never           Illicit Drug Use   In the PAST 12 MONTHS, how often have you used any drugs, including marijuana, cocaine or crack, heroin, methamphetamine, hallucinogens, ecstasy/MDMA?: Never        Hearing Screen   Have you noticed any hearing difficulties?: No  After whispering 9-1-6 how many numbers did the patient repeat correctly?: 3     Vision Screen             Urine Incontinence Screen   Urinary Incontinence  Screen  Do you ever leak urine when you don't want to?: YES     Allergies   Allergen Reactions    Augmentin [Amoxicillin-Pot Clavulanate] Nausea/ Vomiting     OBJECTIVE:   BP (!) 156/82   Pulse 85   Temp 36.6 C (97.8 F) (Temporal)   Resp 16   Ht 1.689 m (5' 6.5")   Wt 105 kg (232 lb)   SpO2 98%   BMI 36.88 kg/m     Physical Exam  Vitals reviewed.   Constitutional:       General: She is not in acute distress.     Appearance: Normal appearance. She is obese.   Cardiovascular:      Rate and Rhythm: Normal rate and regular rhythm.      Pulses: Normal pulses.      Heart sounds: Normal heart sounds.   Pulmonary:      Effort: Pulmonary effort is normal.      Breath sounds: Normal breath sounds.   Abdominal:      General: Bowel sounds are normal.      Palpations: Abdomen is soft.   Skin:     General: Skin is warm and dry.      Capillary Refill: Capillary refill takes less than 2 seconds.   Neurological:      General: No focal deficit present.      Mental Status: She is alert and oriented to person, place, and time. Mental status is at baseline.   Psychiatric:         Attention and Perception: Attention normal.          Mood and Affect: Mood and affect normal.         Speech: Speech normal.         Behavior: Behavior normal. Behavior is cooperative.         Thought Content: Thought content normal.       Health Maintenance Due   Topic Date Due    Osteoporosis screening  Never done    Hepatitis C screening  Never done    Adult Tdap-Td (1 - Tdap) Never done      ASSESSMENT & PLAN:   Assessment/Plan   1. Need for Tdap vaccination    2. Osteoporosis screening    3. Need for hepatitis C screening test    4. Advanced care planning/counseling discussion    5. Medicare annual wellness visit, initial    6. Need for tetanus booster    7. Postmenopausal status       Identified Risk Factors/ Recommended Actions     The PHQ 2 Total: 0 depression screen is interpreted as negative.    Urinary Incontinence Plan of Care: Behavioral interventions with bladder training, pelvic floor muscle training, and/or prompted voiding    Orders Placed This Encounter    DEXA BONE DENSITOMETRY    HEPATITIS C ANTIBODY SCREEN WITH REFLEX TO HCV PCR    diphtheria,pertussis-acell,tetanus (BOOSTRIX TDAP) 2.5-8.5Lf-mcg-Lf/0.61mL IM suspension        The patient has been educated about risk factors and recommended preventive care. Written Prevention Plan completed/ updated and given to patient (see After Visit Summary).  Return in 1 year (on 06/24/2024) for Medicare Well Visit.  Ignacia Marvel, APRN,NP-C

## 2023-06-25 NOTE — Nursing Note (Signed)
06/25/23 0926   Health Education and Literacy   How often do you have a problem understanding what is told to you about your medical condition?  Never   Domestic Violence   Because we are aware of abuse and domestic violence today, we ask all patients: Are you being hurt, hit, or frightened by anyone at your home or in your life?  N   Basic Needs   Do you have any basic needs within your home that are not being met? (such as Food, Shelter, [employer] fast streamer, Tranportation, paying for bills and/or medications) N   Advanced Directives   Do you have any advanced directives? Living Will & MPOA   Do you have the Advanced Directive(s) so we can scan them to your chart? Barbara Park

## 2023-06-25 NOTE — Nursing Note (Signed)
06/25/23 0930   Medicare Wellness Assessment   Medicare initial or wellness physical in the last year? Yes   Advance Directives   Does patient have a living will or MPOA Yes   Has patient provided [employer] with a copy? No   Patient advised to bring in copy. Yes   Activities of Daily Living   Do you need help with dressing, bathing, or walking? No   Do you need help with shopping, housekeeping, medications, or finances? No   Do you have rugs in hallways, broken steps, or poor lighting? No   Do you have grab bars in your bathroom, non-slip strips in your tub, and hand rails on your stairs? Yes   Cognitive Function Screen   What is you age? 1   What is the time to the nearest hour? 1   Remember this address: [address]   What is the year? 1   What is the name of this clinic? 1   Can the patient recognize two persons (the doctor, the nurse, home help, etc.)? 1   What is the date of your birth? (day and month sufficient)  1   In what year did World War II end? 0   Who is the current president of the [place]? 1   Count from 20 down to 1? 1   What address did I give you earlier? 1   Total Score 9   Interpretation of Total Score Greater than 6 Normal   Depression Screen   Little interest or pleasure in doing things. 0   Feeling down, depressed, or hopeless 0   PHQ 2 Total 0   Pain Score   Pain Score Zero   Substance Use Screening   In Past 12 MONTHS, how often have you used any tobacco product (for example, cigarettes, e-cigarettes, cigars, pipes, or smokeless tobacco)? Never   In the PAST 12 MONTHS, how often have you had 5 (men)/4 (women) or more drinks containing alcohol in one day? Never   In the PAST 12 months, how often have you used any prescription medications just for the feeling, more than prescribed, or that were not prescribed for you? Prescriptions may include: opioids, benzodiazepines, medications for ADHD Never   In the PAST 12 MONTHS, how often have you used any drugs,  including marijuana, cocaine or crack, heroin, methamphetamine, hallucinogens, ecstasy/MDMA? Never   Hearing Screen   Have you noticed any hearing difficulties? No   After whispering 9-1-6 how many numbers did the patient repeat correctly? 3   Fall Risk Assessment   Do you feel unsteady when standing or walking? No   Do you worry about falling? No   Have you fallen in the past year? No   Urinary Incontinence Screen   Do you ever leak urine when you don't want to? YES

## 2023-06-25 NOTE — Assessment & Plan Note (Signed)
Lab Results   Component Value Date    HA1C 6.0 06/22/2023   Patient advised on the available treatment options. She is refusing treatment, opting instead to utilize lifestyle modification to decrease A1C.

## 2023-06-25 NOTE — Assessment & Plan Note (Signed)
BP Readings from Last 3 Encounters:   06/25/23 (!) 156/82   06/25/23 (!) 156/82   01/16/23 (!) 166/89   BP elevated in the office today. She endorses compliance with existing antihypertensive regimen. Refill orders placed on Zestoretic and Metoprolol.

## 2023-06-25 NOTE — Progress Notes (Signed)
[facility] MED[facility] OFF[facility]  FAMILY MEDICINE, MEDICAL OFFICE BUILDING  [address]  [place] [address]  [phone]   Barbara Park  June 19, 1955  [id number]    Date of Service: 06/25/2023  Chief complaint:   Chief Complaint   Patient presents with    Follow Up     Subjective:   Barbara Park is a 68 y.o. female and presents today for follow-up on chronic disease management, medication refills and to discuss recent lab work. Patient denies any acute medical complaints that would require intervention.     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 Alwyn Ren     Current Outpatient Medications   Medication Sig Dispense Refill    ascorbic acid (VITAMIN C) 1,000 mg Oral Tablet Take 1 Tablet (1,000 mg total) by mouth Once a day      B.animalis,bifid,infantis,long (PROBIOTIC 4X ORAL) Take by mouth      diphtheria,pertussis-acell,tetanus (BOOSTRIX TDAP) 2.5-8.5Lf-mcg-Lf/0.51mL IM suspension Inject 0.5 mL into the muscle One time for 1 dose 0.5 mL 0    ergocalciferol, vitamin D2, (VITAMIN D) 1,250 mcg (50,000 unit) Oral Capsule Take 1 Capsule (50,000 Units total) by mouth Every 7 days 12 Capsule 1    lisinopriL-hydrochlorothiazide (ZESTORETIC) 10-12.5 mg Oral Tablet Take 1 Tablet by mouth Once a day for 180 days 90 Tablet 1    metoprolol succinate (TOPROL-XL) 50 mg Oral Tablet Sustained Release 24 hr TAKE 1 TABLET BY MOUTH EVERY DAY 90 Tablet 1    multivitamin with folic acid (THERA) 400 mcg Oral Tablet Take 1 Tablet by  mouth Once a day      pantoprazole (PROTONIX) 40 mg Oral Tablet, Delayed Release (E.C.) Take 1 Tablet (40 mg total) by mouth Once a day Indications: indigestion 90 Tablet 1    pravastatin (PRAVACHOL) 40 mg Oral Tablet Take 1 Tablet (40 mg total) by mouth Once a day for 180 days 90 Tablet 1     No current facility-administered medications for this visit.     Allergies   Allergen Reactions    Augmentin [Amoxicillin-Pot Clavulanate] Nausea/ Vomiting     Review of Systems:  Any pertinent Review of Systems as addressed in the HPI above.    Objective:   BP (!) 156/82   Pulse 85   Temp 36.6 C (97.8 F) (Temporal)   Resp 16   Ht 1.689 m (5' 6.5")   Wt 105 kg (232 lb)   SpO2 98%   BMI 36.88 kg/m     BMI addressed: Advised on diet, weight loss, and exercise to reduce above normal BMI.     Physical Exam  Vitals reviewed.   Constitutional:  General: She is not in acute distress.     Appearance: Normal appearance. She is obese.   Cardiovascular:      Rate and Rhythm: Normal rate and regular rhythm.      Pulses: Normal pulses.      Heart sounds: Normal heart sounds.   Pulmonary:      Effort: Pulmonary effort is normal.      Breath sounds: Normal breath sounds.   Abdominal:      General: Bowel sounds are normal.      Palpations: Abdomen is soft.   Skin:     General: Skin is warm and dry.      Capillary Refill: Capillary refill takes less than 2 seconds.   Neurological:      General: No focal deficit present.      Mental Status: She is alert and oriented to person, place, and time. Mental status is at baseline.   Psychiatric:         Attention and Perception: Attention normal.         Mood and Affect: Mood and affect normal.         Speech: Speech normal.         Behavior: Behavior normal. Behavior is cooperative.         Thought Content: Thought content normal.       Laboratory:  A1C: 6  A1C Date: 06/22/2023    COMPLETE BLOOD COUNT   Lab Results   Component Value Date    WBC 5.3 06/22/2023    HGB 15.4 (H) 06/22/2023     HCT 45.2 (H) 06/22/2023    PLTCNT 166 06/22/2023     DIFFERENTIAL  Lab Results   Component Value Date    PMNS 37 (L) 06/22/2023    LYMPHOCYTES 44 06/22/2023    MONOCYTES 10 06/22/2023    EOSINOPHIL 8 (H) 06/22/2023    BASOPHILS 1 06/22/2023    BASOPHILS 0.10 06/22/2023    PMNABS 2.00 06/22/2023    LYMPHSABS 2.30 06/22/2023    EOSABS 0.40 06/22/2023    MONOSABS 0.50 06/22/2023     BASIC METABOLIC PANEL  Lab Results   Component Value Date    SODIUM 142 06/22/2023    POTASSIUM 4.0 06/22/2023    CHLORIDE 105 06/22/2023    CO2 32 (H) 06/22/2023    ANIONGAP 5 06/22/2023    BUN 12 06/22/2023    CREATININE 0.77 06/22/2023    BUNCRRATIO 16 06/22/2023    GFR 85 06/22/2023    CALCIUM 9.3 06/22/2023    GLUCOSE Negative 06/22/2023    GLUCOSENF 117 (H) 06/22/2023      LIPID PROFILE  Lab Results   Component Value Date    CHOLESTEROL 142 06/22/2023    HDLCHOL 49 06/22/2023    LDLCHOL 71 06/22/2023    TRIG 110 06/22/2023     LIVER TESTS  Lab Results   Component Value Date    ALBUMIN 4.2 06/22/2023    AST 38 06/22/2023    ALT 44 06/22/2023    ALKPHOS 54 06/22/2023    TOTBILIRUBIN 0.5 06/22/2023    BILIRUBINCON 0.09 06/22/2023     THYROID STIMULATING HORMONE  Lab Results   Component Value Date    TSH 1.708 06/22/2023   Most recent lab results reviewed with patient.  Assessment & Plan  Essential hypertension  BP Readings from Last 3 Encounters:   06/25/23 (!) 156/82   06/25/23 (!) 156/82   01/16/23 (!) 166/89   BP elevated in  the office today. She endorses compliance with existing antihypertensive regimen. Refill orders placed on Zestoretic and Metoprolol.  Hyperlipidemia, mixed  Lab Results   Component Value Date    TRIG 110 06/22/2023    HDLCHOL 49 06/22/2023    LDLCHOL 71 06/22/2023    CHOLESTEROL 142 06/22/2023   Lipid panel WNL. Pravastatin refill order placed.  Vitamin D deficiency  Lab Results   Component Value Date    VITD 26 (L) 11/10/2022   Patient denies compliance with weekly vitamin D supplement, stating that she  frequently forgets to take it. Refill order placed and patient advised on the importance of following prescription as written.   Prediabetes  Lab Results   Component Value Date    HA1C 6.0 06/22/2023   Patient advised on the available treatment options. She is refusing treatment, opting instead to utilize lifestyle modification to decrease A1C.   Obesity (BMI 35.0-39.9 without comorbidity)  Wt Readings from Last 3 Encounters:   06/25/23 105 kg (232 lb)   06/25/23 105 kg (232 lb)   01/16/23 106 kg (234 lb)   Patient has lost 2 lbs since her last office visit. She was advised on the benefits associated with cardiovascular exercise and caloric restriction.    Counseling with regards to preventative care given. Medication summary was discussed with the patient to verify compliance and understanding. Medication safety was discussed. A good faith effort was made to reconcile the patient's medications. Lab orders were placed for BMP, CBC, Hepatic function panel, Lipid panel and TSH. The patient was given the opportunity to ask questions and those questions were answered to the patient's satisfaction. The patient was encouraged to call with any additional questions or concerns. More than 50% of the visit was spent counseling and coordinating care.    Orders Placed This Encounter    BASIC METABOLIC PANEL    CBC/DIFF    HEPATIC FUNCTION PANEL    LIPID PANEL    THYROID STIMULATING HORMONE WITH FREE T4 REFLEX    pravastatin (PRAVACHOL) 40 mg Oral Tablet    ergocalciferol, vitamin D2, (VITAMIN D) 1,250 mcg (50,000 unit) Oral Capsule    pantoprazole (PROTONIX) 40 mg Oral Tablet, Delayed Release (E.C.)    metoprolol succinate (TOPROL-XL) 50 mg Oral Tablet Sustained Release 24 hr    lisinopriL-hydrochlorothiazide (ZESTORETIC) 10-12.5 mg Oral Tablet     Return in about 6 months (around 12/23/2023).  Ignacia Marvel, APRN,NP-C 06/25/2023, 10:01

## 2023-06-25 NOTE — Assessment & Plan Note (Signed)
Wt Readings from Last 3 Encounters:   06/25/23 105 kg (232 lb)   06/25/23 105 kg (232 lb)   01/16/23 106 kg (234 lb)   Patient has lost 2 lbs since her last office visit. She was advised on the benefits associated with cardiovascular exercise and caloric restriction.

## 2023-06-25 NOTE — Patient Instructions (Signed)
Medicare Preventive Services  Medicare coverage information Recommendation for YOU   Heart Disease and Diabetes   Lipid profile Every 5 years or more often if at risk for cardiovascular disease     Lab Results   Component Value Date    CHOLESTEROL 142 06/22/2023    HDLCHOL 49 06/22/2023    LDLCHOL 71 06/22/2023    TRIG 110 06/22/2023           Diabetes Screening    Yearly for those at risk for diabetes, 2 tests per year for those with prediabetes Last Glucose: 117    Diabetes Self Management Training or Medical Nutrition Therapy  For those with diabetes, up to 10 hrs initial training within a year, subsequent years up to 2 hrs of follow up training Optional for those with diabetes     Medical Nutrition Therapy  Three hours of one-on-one counseling in first year, two hours in subsequent years Optional for those with diabetes, kidney disease   Intensive Behavioral Therapy for Obesity  Face-to-face counseling, first month every week, month 2-6 every other week, month 7-12 every month if continued progress is documented Optional for those with Body Mass Index 30 or higher  Your Body mass index is 36.88 kg/m.   Tobacco Cessation (Quitting) Counseling   Covers up to 8 smoking and tobacco-use cessation counseling sessions in a 13-month period.    Optional for those that use tobacco   Cancer Screening Last Completion Date   Colorectal screening   For anyone age 28 to 61 or any age if high risk:  Screening Colonoscopy every 10 yrs if low risk,  more frequent if higher risk  OR  Cologuard Stool DNA test once every 3 years OR  Fecal Occult Blood Testing yearly OR  Flexible  Sigmoidoscopy  every 5 yr OR  CT Colonography every 5 yrs      See below for due date if applicable.   Screening Pap Test   Recommended every 3 years for all women age 86 to 78, or every five years if combined with HPV test (routine screening not needed after total hysterectomy).  Medicare covers every 2 years or yearly if high risk.  Screening Pelvic Exam    Medicare covers every 2 years, yearly if high risk or childbearing age with abnormal Pap in last 3 yrs.     See below for due date if applicable.   Screening Mammogram   Recommended every 2 years for women age 79 to 83, or more frequent if you have a higher risk. Selectively recommended for women between 40-49 based on shared decisions about risk. Covered by Medicare up to every year for women age 17 or older --10/18/2022  See below for due date if applicable.         Lung Cancer Screening  Annual low dose computed tomography (LDCT scan) is recommended for those age 58-80 who smoked 20 pack-years and are current smokers or quit smoking within past 15 years, after counseling by your doctor or nurse clinician about the possible benefits or harms.     See below for due date if applicable.   Vaccinations   Respiratory syncytial virus (RSV)  Age 57 years or older: Based on shared clinical decision-making with your provider.  Pneumococcal Vaccine  Recommended routinely age 79+ with one or two separate vaccines based on your risk. Recommended before age 30 if medical conditions with increased risk  Seasonal Influenza Vaccine  Once every flu season   Hepatitis  B Vaccine  3 doses if risk (including anyone with diabetes or liver disease)  Shingles Vaccine  Two doses at age 40 or older  Diphtheria Tetanus Pertussis Vaccine  ONCE as adult, booster every 10 years     Immunization History   Administered Date(s) Administered   . Covid-19 Vaccine,Pfizer-BioNTech,Purple Top,13yrs+ 07/16/2019, 08/06/2019, 02/22/2020   . Flucelvax Influenza Vaccine, 6 months + 04/22/2021   . HEPATITIS B VIRUS RECOMB VACCINE 12/31/1995   . Influenza Vaccine, 6 month-adult 02/19/2018, 03/24/2019, 02/21/2023     Shingles vaccine and Diphtheria Tetanus Pertussis vaccines are available at pharmacies or local health department without a prescription.   Other Preventative Screening  Last Completion Date   Bone Densitometry   Screening: All females ages 54  and older every 10 years if initial screening normal. Postmenopausal women ages 77-64 need screening with one or more risk factor: previous fracture, parental hip fracture, current smoker, low body weight, excessive alcohol use, Rheumatoid Arthritis   For women with diagnosed Osteoporosis, follow up is recommended every 2 years or a frequency recommended by your provider.       See below for due date if applicable.     Glaucoma Screening   Yearly if in high risk group such as diabetes, family history, African American age 53+ or Hispanic American age 46+   See your eye care provider for screening.   Hepatitis C Screening   Recommended  for those born between ages 18-79 years.     See below for due date if applicable.     HIV Testing  Recommended routinely at least ONCE, covered every year for age 57 to 22 regardless of risk, and every year for age over 53 who ask for the test or higher risk. Yearly or up to 3 times in pregnancy         See below for due date if applicable.   Abdominal Aortic Aneurysm Screening Ultrasound   Once with a family history of abdominal aortic aneurysms OR a female between65-75 and have smoked at least 100 cigarettes in your lifetime.         See below for due date if applicable.       Your Personalized Schedule for Preventive Tests   Health Maintenance: Pending and Last Completed       Date Due Completion Date    Osteoporosis screening Never done ---    Hepatitis C screening Never done ---    Adult Tdap-Td (1 - Tdap) Never done ---    Colonoscopy 12/23/2023 (Originally 08/05/2001) ---    Shingles Vaccine (1 of 2) 06/24/2024 (Originally 08/06/2006) ---    Pneumococcal Vaccination, Age 6+ (1 of 1 - PCV) 06/24/2024 (Originally 08/06/2006) ---    Depression Screening 06/24/2024 06/25/2023    Breast Cancer Screening 10/17/2024 10/18/2022                For Information on Advanced Directives for Health Care:  [place]:  [email or web address]  [place], [place], [place], [place] General Information:  [email or web address]

## 2023-07-10 ENCOUNTER — Ambulatory Visit
Admission: RE | Admit: 2023-07-10 | Discharge: 2023-07-10 | Disposition: A | Discharge status: Home / Self Care | Payer: Medicare Other | Source: Ambulatory Visit

## 2023-07-10 ENCOUNTER — Other Ambulatory Visit: Payer: Self-pay

## 2023-07-10 DIAGNOSIS — Z780 Asymptomatic menopausal state: Secondary | ICD-10-CM | POA: Insufficient documentation

## 2023-07-10 DIAGNOSIS — Z13820 Encounter for screening for osteoporosis: Secondary | ICD-10-CM | POA: Insufficient documentation

## 2023-10-18 ENCOUNTER — Other Ambulatory Visit (INDEPENDENT_AMBULATORY_CARE_PROVIDER_SITE_OTHER): Payer: Self-pay | Admitting: Family Medicine

## 2023-10-18 DIAGNOSIS — I10 Essential (primary) hypertension: Secondary | ICD-10-CM

## 2023-11-20 ENCOUNTER — Other Ambulatory Visit (HOSPITAL_COMMUNITY): Payer: Self-pay | Admitting: Student in an Organized Health Care Education/Training Program

## 2023-11-20 DIAGNOSIS — Z1239 Encounter for other screening for malignant neoplasm of breast: Secondary | ICD-10-CM

## 2023-12-06 ENCOUNTER — Other Ambulatory Visit: Payer: Self-pay

## 2023-12-06 ENCOUNTER — Ambulatory Visit
Admission: RE | Admit: 2023-12-06 | Discharge: 2023-12-06 | Disposition: A | Discharge status: Home / Self Care | Source: Ambulatory Visit | Attending: Student in an Organized Health Care Education/Training Program | Admitting: Student in an Organized Health Care Education/Training Program

## 2023-12-06 ENCOUNTER — Encounter (HOSPITAL_COMMUNITY): Payer: Self-pay

## 2023-12-06 DIAGNOSIS — Z1231 Encounter for screening mammogram for malignant neoplasm of breast: Secondary | ICD-10-CM | POA: Insufficient documentation

## 2023-12-06 DIAGNOSIS — Z1239 Encounter for other screening for malignant neoplasm of breast: Secondary | ICD-10-CM

## 2023-12-31 ENCOUNTER — Ambulatory Visit (INDEPENDENT_AMBULATORY_CARE_PROVIDER_SITE_OTHER): Payer: Self-pay

## 2023-12-31 ENCOUNTER — Encounter (INDEPENDENT_AMBULATORY_CARE_PROVIDER_SITE_OTHER): Payer: Self-pay | Admitting: Family Medicine

## 2024-03-15 ENCOUNTER — Other Ambulatory Visit (INDEPENDENT_AMBULATORY_CARE_PROVIDER_SITE_OTHER): Payer: Self-pay

## 2024-03-21 ENCOUNTER — Other Ambulatory Visit (INDEPENDENT_AMBULATORY_CARE_PROVIDER_SITE_OTHER): Payer: Self-pay

## 2024-05-02 ENCOUNTER — Telehealth (INDEPENDENT_AMBULATORY_CARE_PROVIDER_SITE_OTHER): Payer: Self-pay | Admitting: Family Medicine

## 2024-05-02 NOTE — Nursing Note (Signed)
 05/01/24 1000   OTHER   Systolic 138   Diastolic 88   BP Cuff Type Automatic

## 2024-05-02 NOTE — Telephone Encounter (Signed)
 Spoke with patient. States she has been checking her b/p and it's been pretty good. Top number usually stays in the upper 130's (Occ gets 140-142) and bottom number in the 80's.

## 2024-06-26 ENCOUNTER — Ambulatory Visit (INDEPENDENT_AMBULATORY_CARE_PROVIDER_SITE_OTHER): Payer: Self-pay | Admitting: Family Medicine
